# Patient Record
Sex: Female | Born: 1970
Health system: Southern US, Community
[De-identification: ages and names within clinical notes are randomized; demographics above are authoritative.]

## PROBLEM LIST (undated history)

## (undated) DIAGNOSIS — E039 Hypothyroidism, unspecified: Secondary | ICD-10-CM

## (undated) DIAGNOSIS — F32A Depression, unspecified: Secondary | ICD-10-CM

## (undated) DIAGNOSIS — F419 Anxiety disorder, unspecified: Secondary | ICD-10-CM

## (undated) DIAGNOSIS — F329 Major depressive disorder, single episode, unspecified: Secondary | ICD-10-CM

## (undated) HISTORY — PX: NO PAST SURGERIES: SHX2092

## (undated) HISTORY — PX: OTHER SURGICAL HISTORY: SHX169

---

## 2004-08-11 ENCOUNTER — Encounter: Payer: Self-pay | Admitting: General Practice

## 2004-09-08 ENCOUNTER — Encounter: Payer: Self-pay | Admitting: General Practice

## 2006-05-31 ENCOUNTER — Ambulatory Visit: Payer: Self-pay | Admitting: Family Medicine

## 2006-07-12 ENCOUNTER — Ambulatory Visit: Payer: Self-pay | Admitting: Gastroenterology

## 2006-12-25 ENCOUNTER — Emergency Department: Payer: Self-pay | Admitting: Emergency Medicine

## 2007-04-02 IMAGING — US ABDOMEN ULTRASOUND
1 series · 17 of 25 positions shown · non-contrast
Comparison: none

REASON FOR EXAM: RUQ abd pain
COMMENTS:

[Series 1: abdomen ultrasound · 17 of 50 slices shown]
[im 1/50]
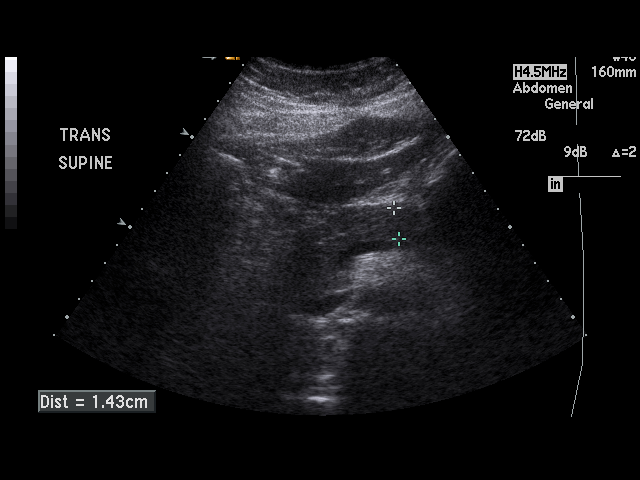
[im 5/50]
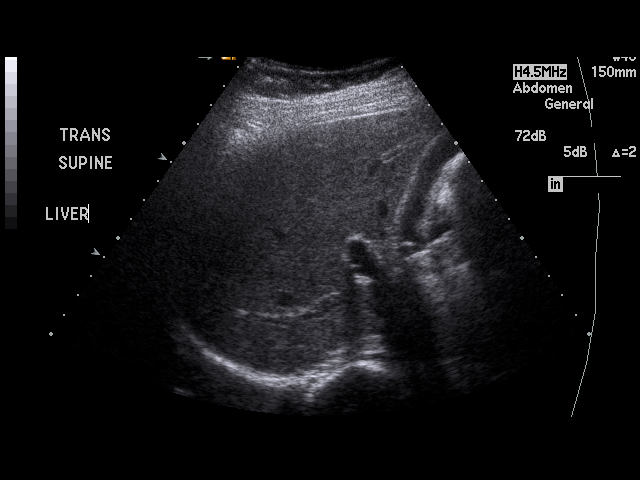
[im 7/50]
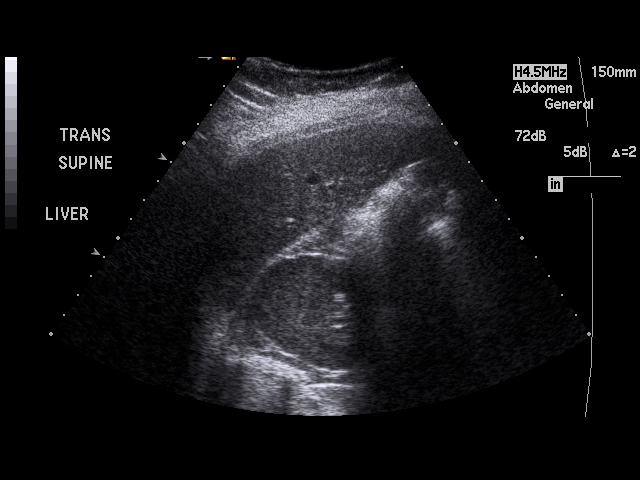
[im 11/50]
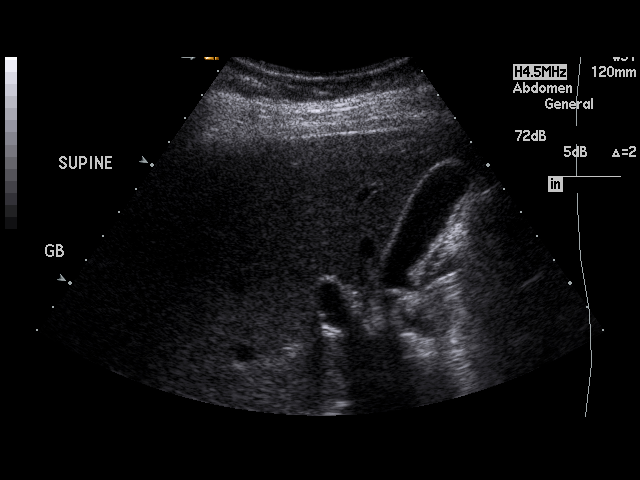
[im 13/50]
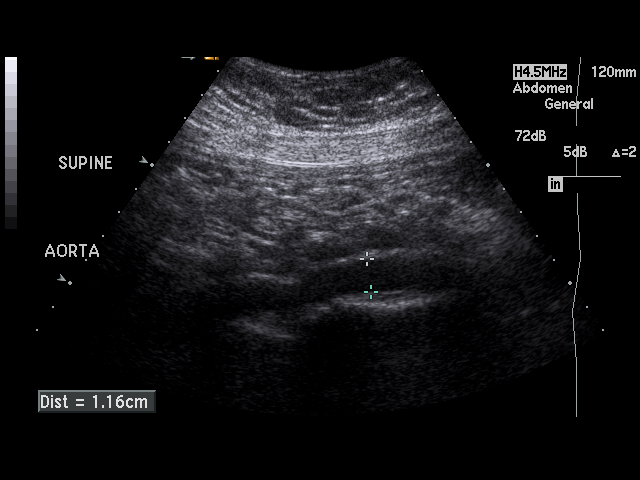
[im 17/50]
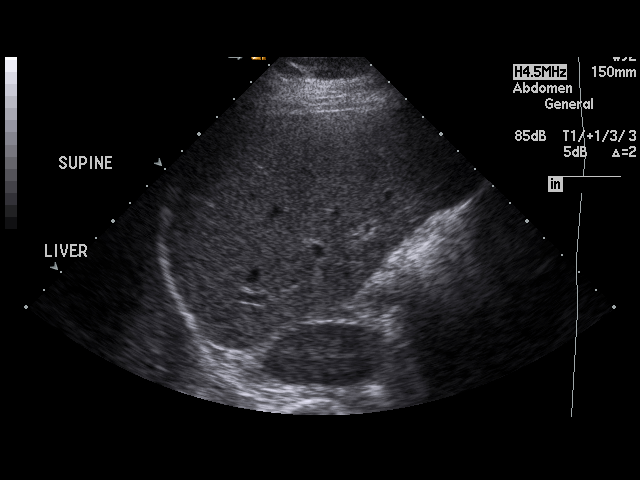
[im 19/50]
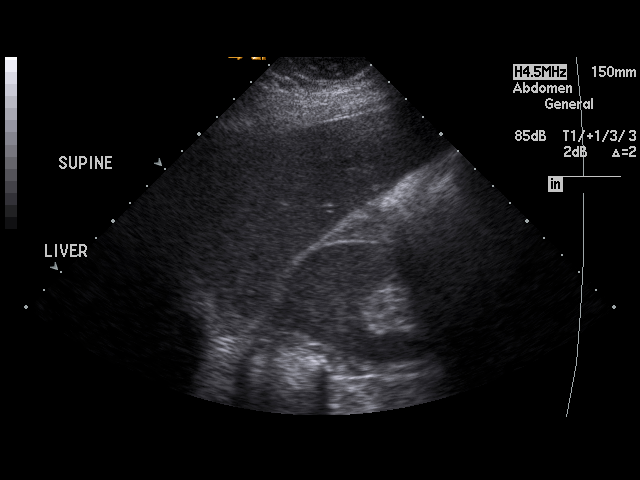
[im 23/50]
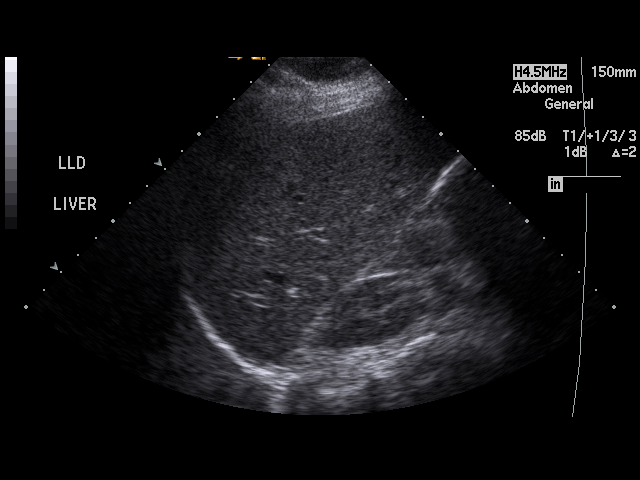
[im 25/50]
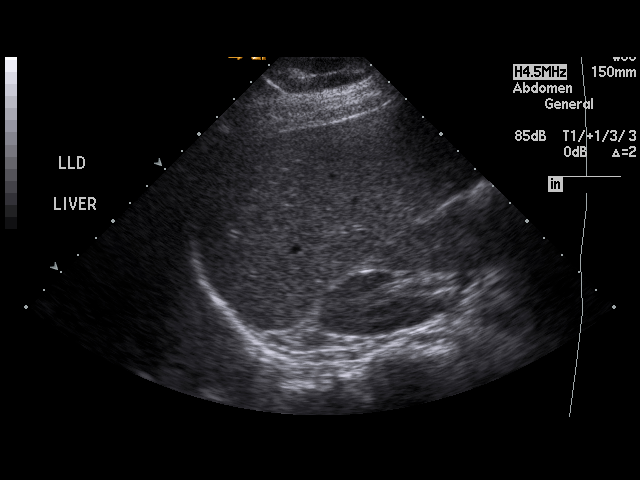
[im 27/50]
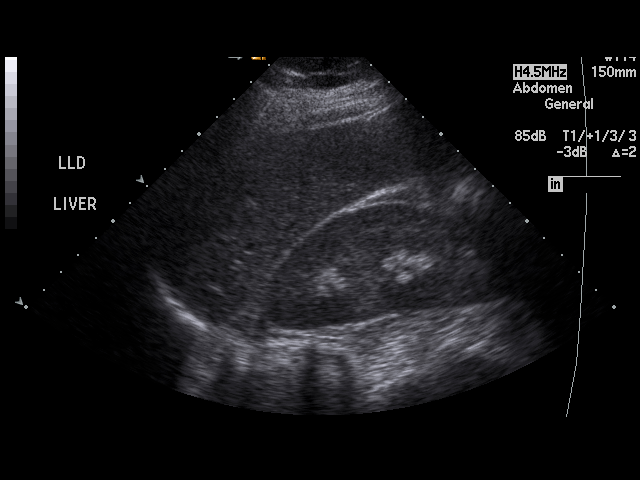
[im 31/50]
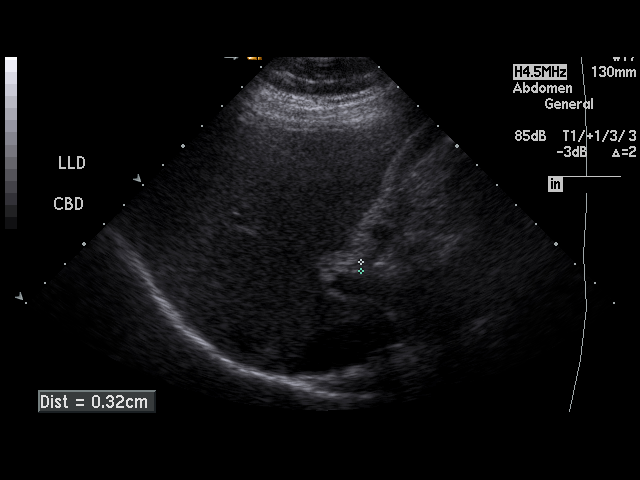
[im 33/50]
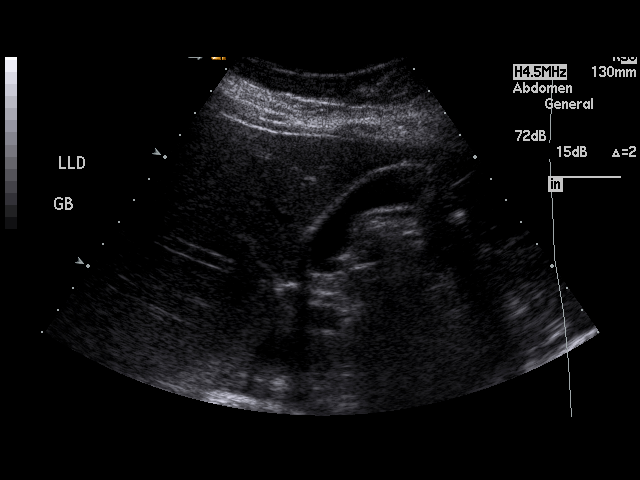
[im 37/50]
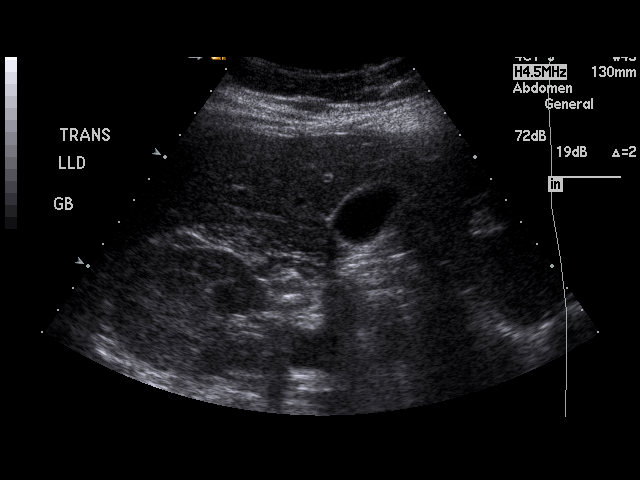
[im 39/50]
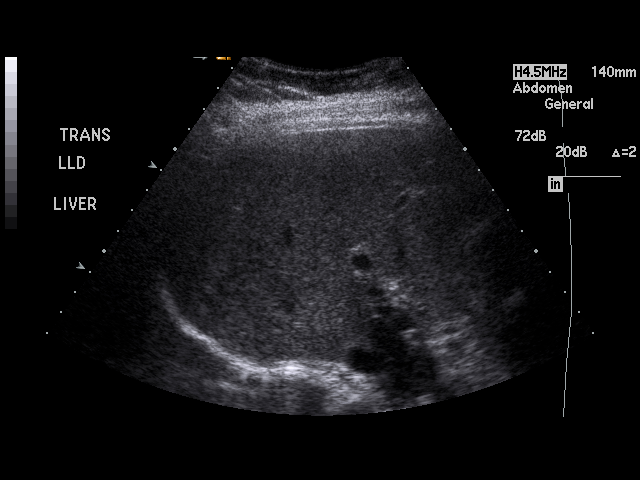
[im 43/50]
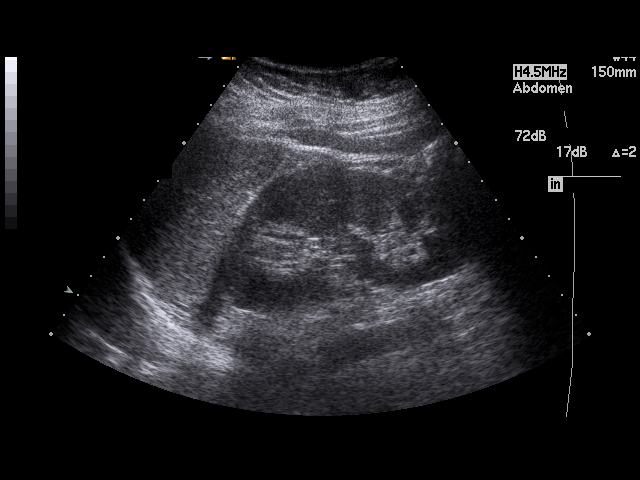
[im 45/50]
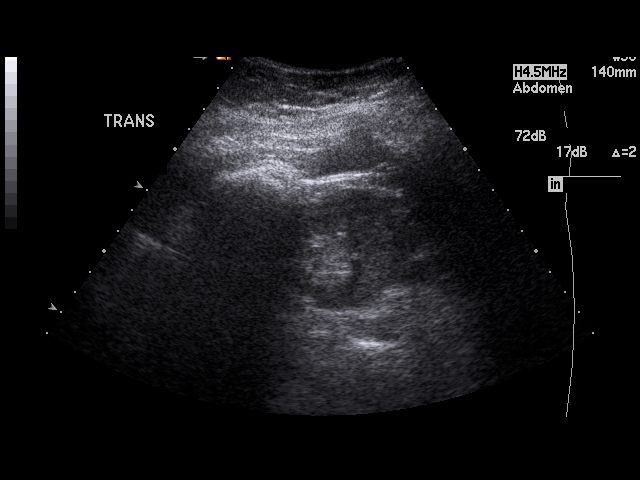
[im 50/50]
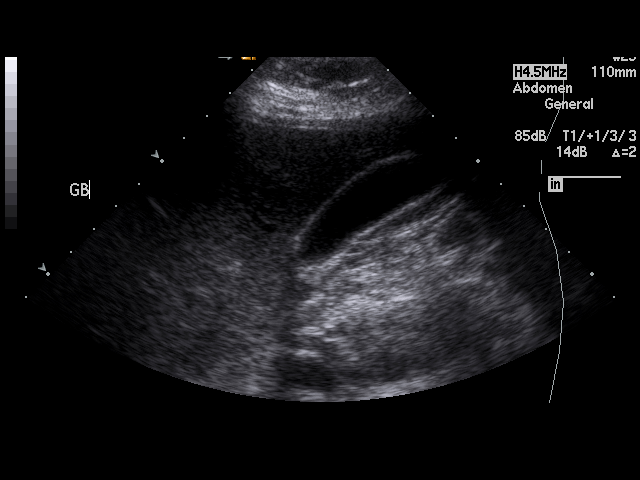

[17 of 25 positions shown; findings below may reference images not displayed]

PROCEDURE:     US  - US ABDOMEN GENERAL SURVEY  - May 31, 2006  [DATE]

RESULT:     The liver, spleen and pancreas are normal in appearance. No
gallstones are seen. There is no thickening of the gallbladder wall. The
common bile duct measures 3.3 mm in diameter, which is within normal limits.
The kidneys show no hydronephrosis. There is no ascites.
IMPRESSION: 1)No significant abnormalities are noted.

## 2011-11-30 ENCOUNTER — Emergency Department: Payer: Self-pay | Admitting: Emergency Medicine

## 2014-12-25 ENCOUNTER — Emergency Department: Payer: Self-pay | Admitting: Emergency Medicine

## 2015-06-17 ENCOUNTER — Emergency Department
Admission: EM | Admit: 2015-06-17 | Discharge: 2015-06-17 | Disposition: A | Discharge status: Home / Self Care | Payer: BLUE CROSS/BLUE SHIELD | Attending: Emergency Medicine | Admitting: Emergency Medicine

## 2015-06-17 ENCOUNTER — Encounter: Payer: Self-pay | Admitting: Emergency Medicine

## 2015-06-17 DIAGNOSIS — R51 Headache: Secondary | ICD-10-CM | POA: Diagnosis present

## 2015-06-17 DIAGNOSIS — R519 Headache, unspecified: Secondary | ICD-10-CM

## 2015-06-17 DIAGNOSIS — Z880 Allergy status to penicillin: Secondary | ICD-10-CM | POA: Diagnosis not present

## 2015-06-17 DIAGNOSIS — J3489 Other specified disorders of nose and nasal sinuses: Secondary | ICD-10-CM | POA: Insufficient documentation

## 2015-06-17 MED ORDER — METOCLOPRAMIDE HCL 10 MG PO TABS: 10.0000 mg | ORAL_TABLET | Freq: Three times a day (TID) | ORAL | Status: DC

## 2015-06-17 MED ORDER — DEXAMETHASONE 4 MG PO TABS
6.0000 mg | ORAL_TABLET | Freq: Once | ORAL | Status: AC
  Administered 2015-06-17: 6 mg via ORAL
  Filled 2015-06-17: qty 1.5

## 2015-06-17 MED ORDER — METOCLOPRAMIDE HCL 10 MG PO TABS
10.0000 mg | ORAL_TABLET | Freq: Once | ORAL | Status: AC
  Administered 2015-06-17: 10 mg via ORAL
  Filled 2015-06-17: qty 1

## 2015-06-17 MED ORDER — KETOROLAC TROMETHAMINE 10 MG PO TABS
10.0000 mg | ORAL_TABLET | Freq: Once | ORAL | Status: AC
  Administered 2015-06-17: 10 mg via ORAL
  Filled 2015-06-17: qty 1

## 2015-06-17 MED ORDER — DIPHENHYDRAMINE HCL 50 MG PO CAPS
50.0000 mg | ORAL_CAPSULE | Freq: Once | ORAL | Status: AC
  Administered 2015-06-17: 50 mg via ORAL
  Filled 2015-06-17: qty 1

## 2015-06-17 MED ORDER — DIPHENHYDRAMINE HCL 25 MG PO CAPS: 50.0000 mg | ORAL_CAPSULE | Freq: Four times a day (QID) | ORAL | Status: AC | PRN

## 2015-06-17 NOTE — Discharge Instructions (Signed)

## 2015-06-17 NOTE — ED Notes (Signed)
Pt to ed with c/o headache since Friday.  Pt states she has tried tylenol without relief.  Pt alert and oriented at this time, no neuro deficits noted.

## 2015-06-17 NOTE — ED Provider Notes (Signed)
[facility] Emergency Department Provider Note  ____________________________________________  Time seen: 10:05 AM  I have reviewed the triage vital signs and the nursing notes.   HISTORY  Chief Complaint Headache    HPI Donna Neal is a 44 y.o. female who complains of headache for the past 5 days. Gradual in onset and constant. No aggravating or alleviating factors. No numbness tingling weakness dizziness or syncope. No vision changes. She's had similar headaches in the past and has a history of migraines. She tried Tylenol without relief. No history of trauma. She is having rhinorrhea and states that she has frequent seasonal allergies.  Headache is bilateral frontal and throbbing.   History reviewed. No pertinent past medical history. Migraine headaches Seasonal allergies There are no active problems to display for this patient.   History reviewed. No pertinent past surgical history.  Current Outpatient Rx  Name  Route  Sig  Dispense  Refill  . diphenhydrAMINE (BENADRYL) 25 mg capsule   Oral   Take 2 capsules (50 mg total) by mouth every 6 (six) hours as needed.   60 capsule   0   . metoCLOPramide (REGLAN) 10 MG tablet   Oral   Take 1 tablet (10 mg total) by mouth 4 (four) times daily -  before meals and at bedtime.   60 tablet   0     Allergies Aspirin; Mucinex; Penicillins; and Zithromax  History reviewed. No pertinent family history.  Social History History  Substance Use Topics  . Smoking status: Never Smoker   . Smokeless tobacco: Not on file  . Alcohol Use: No    Review of Systems  Constitutional: No fever or chills. No weight changes Eyes:No blurry vision or double vision.  ENT: No sore throat. Positive rhinorrhea Cardiovascular: No chest pain. Respiratory: No dyspnea or cough. Gastrointestinal: Negative for abdominal pain, vomiting and diarrhea.  No BRBPR or melena. Genitourinary: Negative for dysuria, urinary  retention, bloody urine, or difficulty urinating. Musculoskeletal: Negative for back pain. No joint swelling or pain. Skin: Negative for rash. Neurological: Positive headache as above. Psychiatric:No anxiety or depression.   Endocrine:No hot/cold intolerance, changes in energy, or sleep difficulty.  10-point ROS otherwise negative.  ____________________________________________   PHYSICAL EXAM:  VITAL SIGNS: ED Triage Vitals  Enc Vitals Group     BP 06/17/15 0959 154/74 mmHg     Pulse Rate 06/17/15 0959 63     Resp 06/17/15 0959 20     Temp 06/17/15 0959 98.2 F (36.8 C)     Temp Source 06/17/15 0959 Oral     SpO2 06/17/15 0959 100 %     Weight 06/17/15 0959 170 lb (77.111 kg)     Height 06/17/15 0959  (1.575 m)     Head Cir --      Peak Flow --      Pain Score 06/17/15 0959 8     Pain Loc --      Pain Edu? --      Excl. in GC? --      Constitutional: Alert and oriented. Well appearing and in no distress. Eyes: No scleral icterus. No conjunctival pallor. PERRL. EOMI and painless. Funduscopy normal ENT   Head: Normocephalic and atraumatic. No pain on percussion of sinuses   Nose: Positive rhinorrhea. No septal hematoma   Mouth/Throat: MMM, no pharyngeal erythema. No peritonsillar mass. No uvula shift.   Neck: No stridor. No SubQ emphysema. No meningismus. Hematological/Lymphatic/Immunilogical: No cervical lymphadenopathy. Cardiovascular: RRR. Normal  and symmetric distal pulses are present in all extremities. No murmurs, rubs, or gallops. Respiratory: Normal respiratory effort without tachypnea nor retractions. Breath sounds are clear and equal bilaterally. No wheezes/rales/rhonchi. Gastrointestinal: Soft and nontender. No distention. There is no CVA tenderness.  No rebound, rigidity, or guarding. Genitourinary: deferred Musculoskeletal: Nontender with normal range of motion in all extremities. No joint effusions.  No lower extremity tenderness.  No  edema. Neurologic:   Normal speech and language.  CN 2-10 normal. Motor grossly intact. No pronator drift.  Normal gait. No gross focal neurologic deficits are appreciated.  Skin:  Skin is warm, dry and intact. No rash noted.  No petechiae, purpura, or bullae. Psychiatric: Mood and affect are normal. Speech and behavior are normal. Patient exhibits appropriate insight and judgment.  ____________________________________________    LABS (pertinent positives/negatives) (all labs ordered are listed, but only abnormal results are displayed) Labs Reviewed - No data to display ____________________________________________   EKG    ____________________________________________    RADIOLOGY    ____________________________________________   PROCEDURES  ____________________________________________   INITIAL IMPRESSION / ASSESSMENT AND PLAN / ED COURSE  Pertinent labs & imaging results that were available during my care of the patient were reviewed by me and considered in my medical decision making (see chart for details).  Patient presents with headache that is not concerning for tumor or intracranial hemorrhage or stroke. Low suspicion for meningitis encephalitis carotid injury several arteritis or glaucoma. No evidence of traumatic injury. This appears to be a benign type of headache which will treat symptomatically and managed outpatient with prescription medications. It is likely related to her seasonal allergies although there does not appear to be any significant sinusitis at this time.  ____________________________________________   FINAL CLINICAL IMPRESSION(S) / ED DIAGNOSES  Final diagnoses:  Acute nonintractable headache, unspecified headache type      Sharman Cheek, MD 06/17/15 1207

## 2015-07-08 ENCOUNTER — Other Ambulatory Visit: Payer: Self-pay | Admitting: Adult Health

## 2015-07-08 DIAGNOSIS — E669 Obesity, unspecified: Secondary | ICD-10-CM | POA: Insufficient documentation

## 2015-07-08 DIAGNOSIS — N63 Unspecified lump in unspecified breast: Secondary | ICD-10-CM

## 2015-07-24 ENCOUNTER — Ambulatory Visit
Admission: RE | Admit: 2015-07-24 | Discharge: 2015-07-24 | Disposition: A | Discharge status: Home / Self Care | Payer: BLUE CROSS/BLUE SHIELD | Source: Ambulatory Visit | Attending: Family Medicine | Admitting: Family Medicine

## 2015-07-24 DIAGNOSIS — N63 Unspecified lump in unspecified breast: Secondary | ICD-10-CM

## 2015-08-08 DIAGNOSIS — G43719 Chronic migraine without aura, intractable, without status migrainosus: Secondary | ICD-10-CM | POA: Insufficient documentation

## 2015-11-10 IMAGING — US US BREAST LTD UNI RIGHT INC AXILLA
1 series · 8 of 8 positions shown · non-contrast
Comparison: None.

CLINICAL DATA: Palpable lump right breast

EXAM:
DIGITAL DIAGNOSTIC BILATERAL MAMMOGRAM WITH 3D TOMOSYNTHESIS WITH
CAD
ULTRASOUND RIGHT BREAST

[Series 1: us breast ltd uni right inc axilla · 0.07mm/px · 8 of 8 slices shown]
[im 1/8]
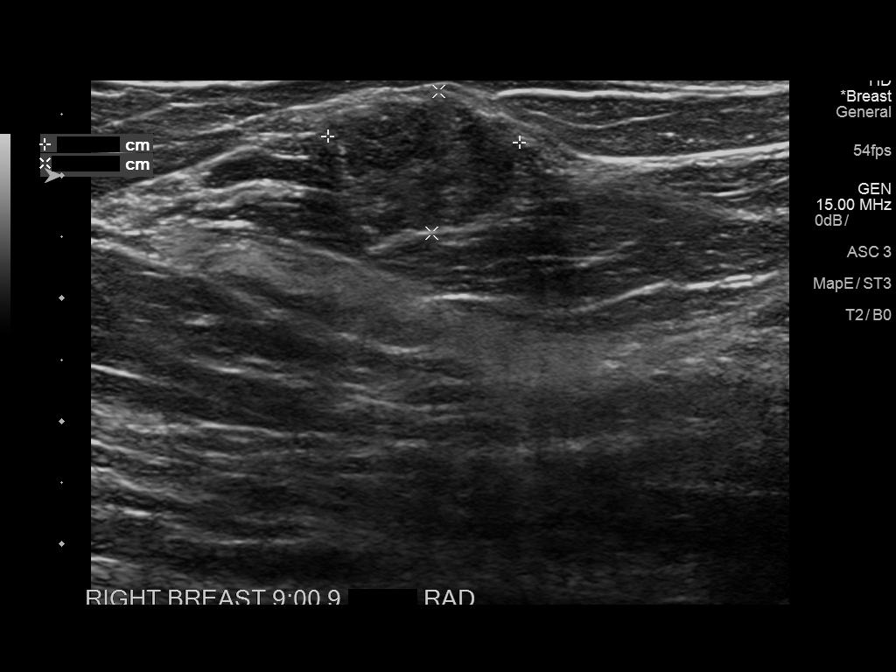
[im 2/8]
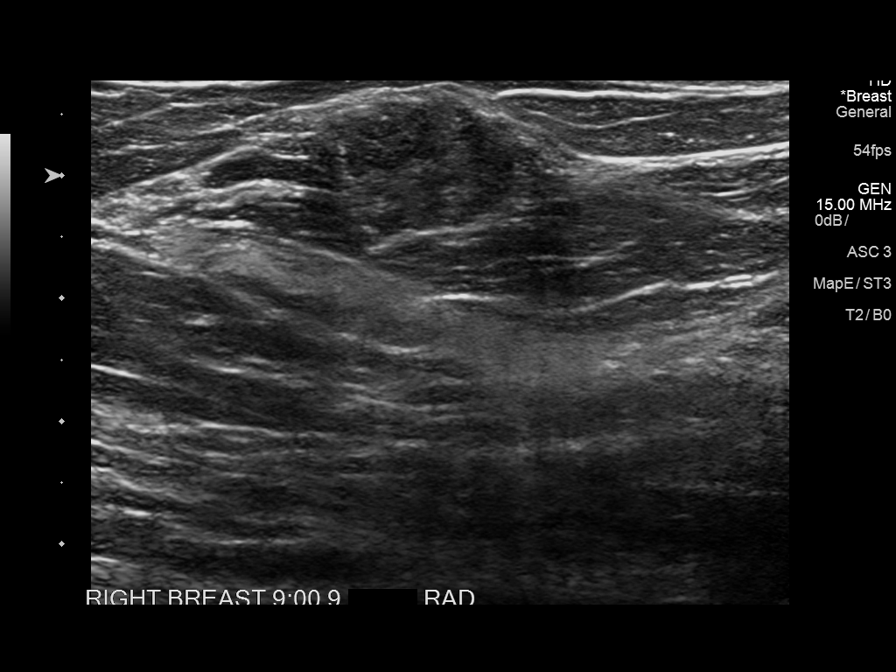
[im 3/8]
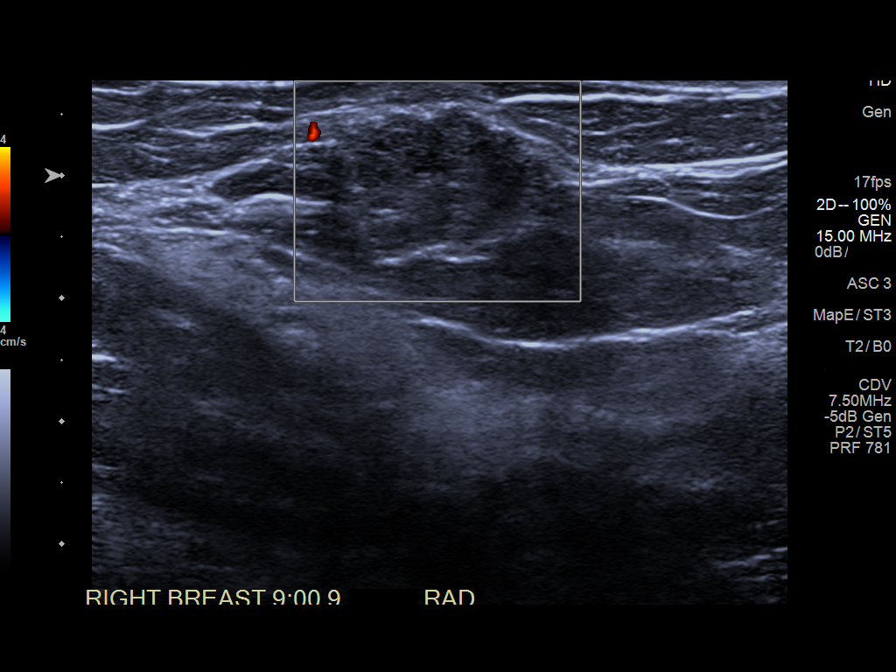
[im 4/8]
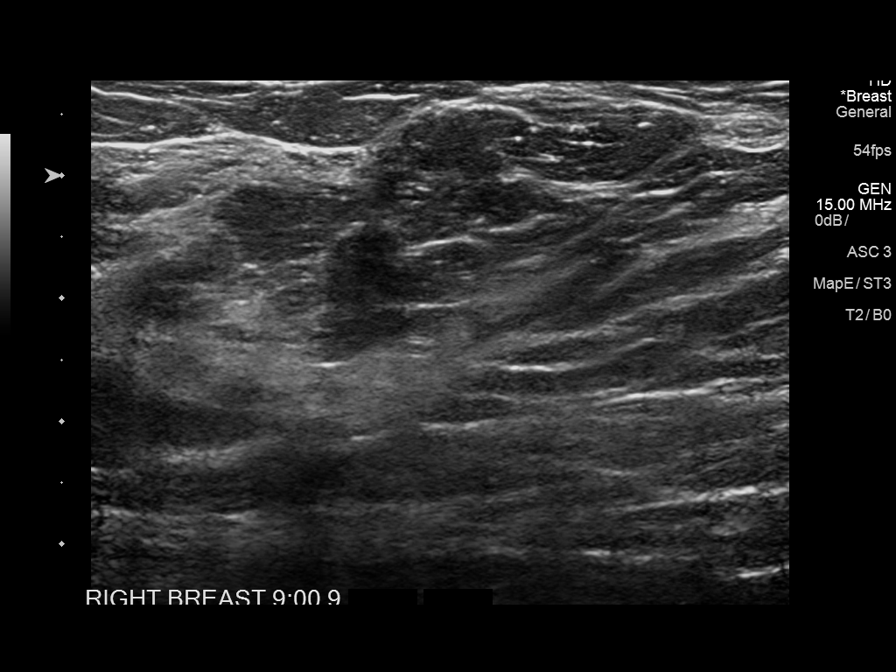
[im 5/8]
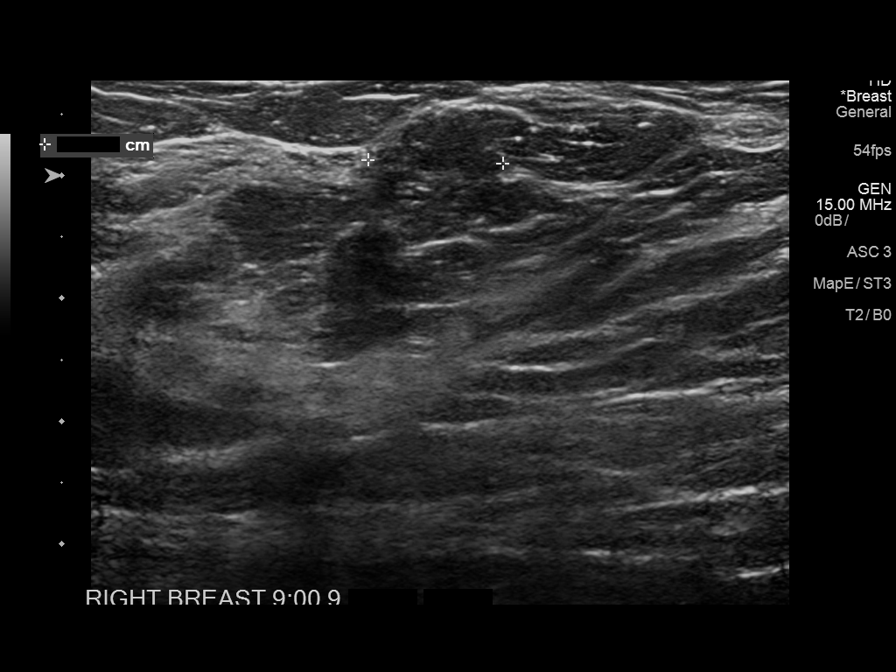
[im 6/8]
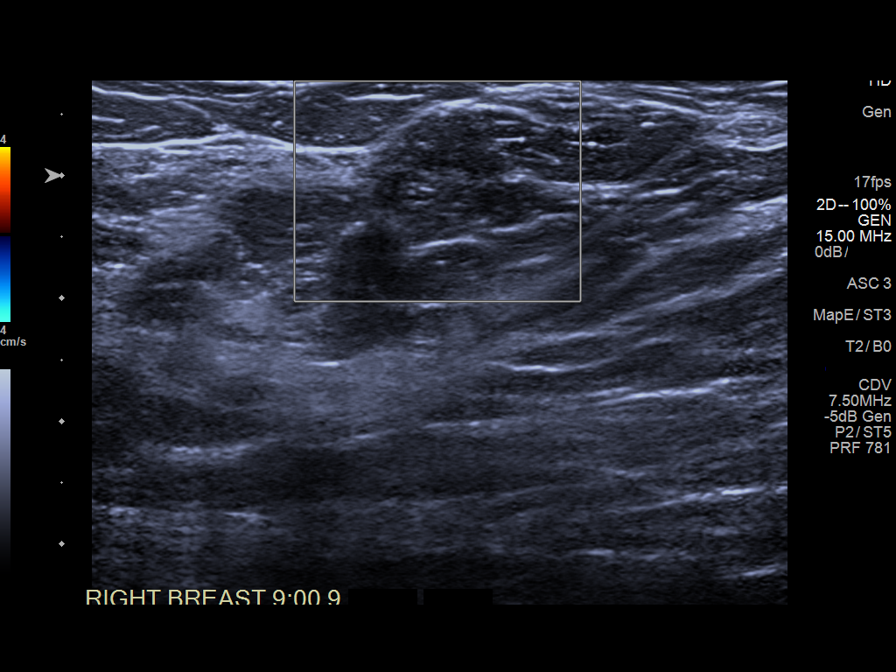
[im 7/8]
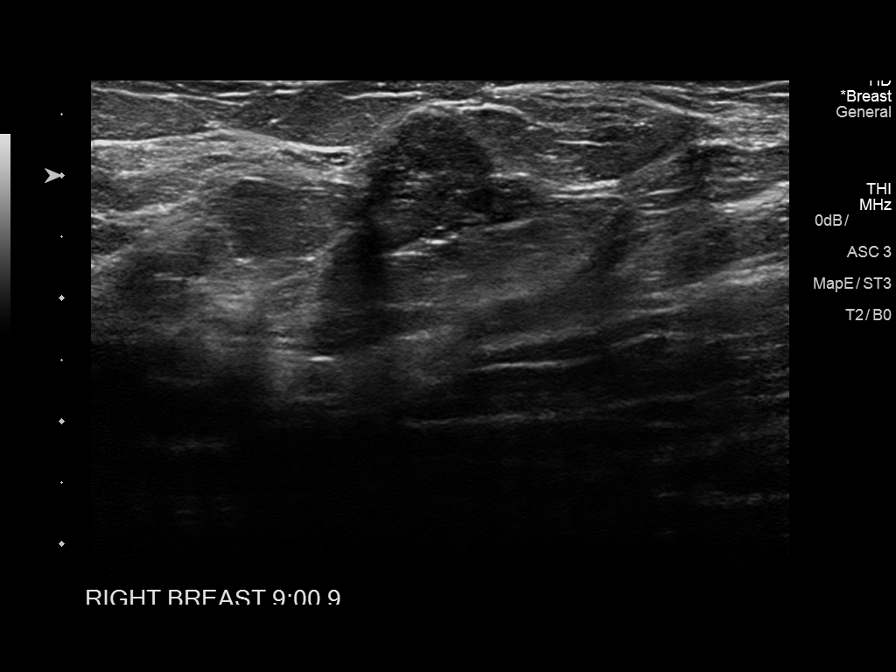
[im 8/8]
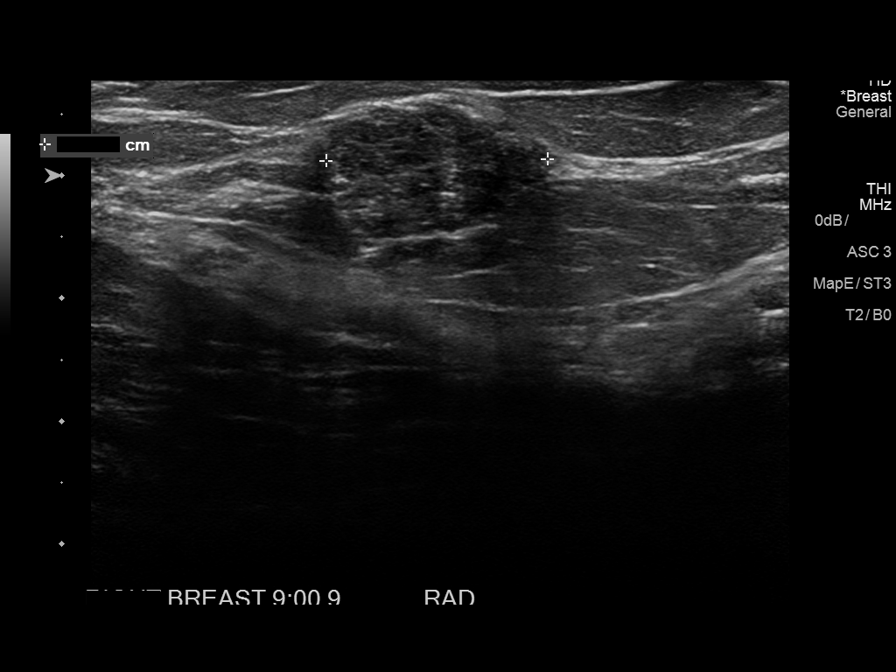

[8 of 8 positions shown; findings below may reference images not displayed]

ACR Breast Density Category b: There are scattered areas of
fibroglandular density.
FINDINGS: Cc and MLO views of bilateral breasts, spot for magnification CC and
lateral views of the left breast, spot tangential view of right
breast are submitted. In the palpable area right breast 9 o'clock
position, there is a oval mass. Images of the left breast
demonstrate a group of probable benign calcifications in the
upper-outer quadrant left breast.

Mammographic images were processed with CAD.

Targeted ultrasound is performed, showing oval hypoechoic lesion at
the right breast 9 o'clock 9 cm from nipple measuring 1.6 x 1.2 x
1.8 cm. This correlates to the mammographic finding.
IMPRESSION: Probable benign findings.

RECOMMENDATION:
Recommend six-month follow-up mammogram and ultrasound for right
breast mass and six-month follow-up mammogram for left breast
calcifications.

I have discussed the findings and recommendations with the patient.
Results were also provided in writing at the conclusion of the
visit. If applicable, a reminder letter will be sent to the patient
regarding the next appointment.

BI-RADS CATEGORY  3: Probably benign.

## 2015-11-27 DIAGNOSIS — E559 Vitamin D deficiency, unspecified: Secondary | ICD-10-CM | POA: Insufficient documentation

## 2018-01-11 ENCOUNTER — Encounter: Payer: Self-pay | Admitting: Family Medicine

## 2018-01-11 ENCOUNTER — Ambulatory Visit: Payer: BLUE CROSS/BLUE SHIELD | Admitting: Family Medicine

## 2018-01-11 VITALS — BP 138/68 | HR 100 | Temp 98.6°F | Resp 16 | Ht 61.0 in | Wt 190.6 lb

## 2018-01-11 DIAGNOSIS — Z8639 Personal history of other endocrine, nutritional and metabolic disease: Secondary | ICD-10-CM | POA: Diagnosis not present

## 2018-01-11 DIAGNOSIS — Z131 Encounter for screening for diabetes mellitus: Secondary | ICD-10-CM

## 2018-01-11 DIAGNOSIS — Z13220 Encounter for screening for lipoid disorders: Secondary | ICD-10-CM

## 2018-01-11 DIAGNOSIS — F320 Major depressive disorder, single episode, mild: Secondary | ICD-10-CM

## 2018-01-11 DIAGNOSIS — R5383 Other fatigue: Secondary | ICD-10-CM | POA: Diagnosis not present

## 2018-01-11 DIAGNOSIS — Z113 Encounter for screening for infections with a predominantly sexual mode of transmission: Secondary | ICD-10-CM

## 2018-01-11 DIAGNOSIS — Z6836 Body mass index (BMI) 36.0-36.9, adult: Secondary | ICD-10-CM

## 2018-01-11 DIAGNOSIS — Z1211 Encounter for screening for malignant neoplasm of colon: Secondary | ICD-10-CM | POA: Diagnosis not present

## 2018-01-11 DIAGNOSIS — Z0000 Encounter for general adult medical examination without abnormal findings: Secondary | ICD-10-CM | POA: Diagnosis not present

## 2018-01-11 DIAGNOSIS — E6609 Other obesity due to excess calories: Secondary | ICD-10-CM

## 2018-01-11 DIAGNOSIS — R198 Other specified symptoms and signs involving the digestive system and abdomen: Secondary | ICD-10-CM | POA: Diagnosis not present

## 2018-01-11 DIAGNOSIS — Z1239 Encounter for other screening for malignant neoplasm of breast: Secondary | ICD-10-CM

## 2018-01-11 MED ORDER — ESCITALOPRAM OXALATE 10 MG PO TABS: 10.0000 mg | ORAL_TABLET | Freq: Every day | ORAL | 0 refills | Status: DC

## 2018-01-11 NOTE — Progress Notes (Signed)
Name: Donna Neal   MRN: [id number]    DOB: 1971-04-25   Date:01/11/2018       Progress Note  Subjective  Chief Complaint  Chief Complaint  Patient presents with  . Annual Exam    HPI     Patient presents for annual CPE and the following concerns:  Abnormal Bowel Movements/Abdominal Pain: Patient endorses some indigestion to foods she normally eats - worse with greasy/heavy meals.  She reports having a "virus" 3 weeks ago and having consitpation after this. Prior to this episode, she would have BM's once a week, she took 2 exlax laxative one time 2 weeks ago and has been having 4-7BMs a week since then.  Pt noted one episode of blood in stool. Denies mucous or black and tarry stools. Endorses intermittent abdominal pain since her "virus" that resolves after BM.  Diet: Diet changed for the last 3 weeks since feeling ill - she states has recovered from a viral illness and now typically skips breakfast and has been eating broth, applesauce and jello, some pastas and occasionally chicken sandwiches. Drinks juice, sprite and water.  Exercise: Patient states at work walks a lot but she has to look after her niece when she gets home- and she is exhausted.   USPSTF grade A and B recommendations  Depression: States she has an intermittent depression for many years, byut has never been on medications or sought counseling, but would be willing to discuss medications today help improve mood and anxiety. Denies SI/HI.  Endorses fatigue and lack of motivation in her daily life. Depression screen PHQ 2/9 01/11/2018  Decreased Interest 1  Down, Depressed, Hopeless 0  PHQ - 2 Score 1  Altered sleeping 2  Tired, decreased energy 2  Change in appetite 0  Feeling bad or failure about yourself  1  Trouble concentrating 0  Moving slowly or fidgety/restless 0  Suicidal thoughts 0  PHQ-9 Score 6  Difficult doing work/chores Not difficult at all   Hypertension: Notes a history of elevated BP readings  in the past; at goal today, we will continue to monitor. BP Readings from Last 3 Encounters:  01/11/18 138/68  06/17/15 (!) 139/92  Patient states she has gotten headaches in the past when hypertensive but is good today.  Obesity: Wt Readings from Last 3 Encounters:  01/11/18 190 lb 9.6 oz (86.5 kg)  06/17/15 170 lb (77.1 kg)   BMI Readings from Last 3 Encounters:  01/11/18 36.01 kg/m  06/17/15 31.09 kg/m     Alcohol: no Tobacco use: no HIV, hep B, hep C: does not qualify for Hep C. Will complete STD testing- pt is sexually active  STD testing and prevention (chl/gon/syphilis): discussed- pt uses condoms. Will check today  Intimate partner violence: no Sexual History/Pain during Intercourse: no Menstrual History/LMP/Abnormal Bleeding: no longer having periods stopped June 2017.  Incontinence Symptoms: denies   Advanced Care Planning: A voluntary discussion about advance care planning including the explanation and discussion of advance directives.  Discussed health care proxy and Living will, and the patient was able to identify a health care proxy as Mother- Donna Neal.  Patient does not have a living will at present time. If patient does have living will, I have requested they bring this to the clinic to be scanned in to their chart.  Breast cancer: maternal aunt- 2016; Last mammogram was in 2015 without abnormal findings No results found for: [facility]  BRCA gene screening: doesn't qualify  Cervical cancer screening: Normal  Pap 2016- wants to check with insurance company to ensure coverage  Osteoporosis: no family history  No results found for: HMDEXASCAN  Fall prevention/vitamin D: has hx of vitamin D deficiency takes it OTC in the past. Will check Vitamin D levels today Lipids: will check today  No results found for: CHOL No results found for: HDL No results found for: LDLCALC No results found for: TRIG No results found for: CHOLHDL No results found for:  LDLDIRECT  Glucose: Will check today No results found for: GLUCOSE, GLUCAP  Skin cancer: hasn't noticed any changes in skin  Colorectal cancer: No family history see HPI    There are no active problems to display for this patient.   Past Surgical History:  Procedure Laterality Date  . no past surgery      Family History  Problem Relation Age of Onset  . Hypertension Mother   . Hypertension Brother   . Hypertension Maternal Aunt   . Cancer Maternal Uncle   . Cancer Maternal Grandmother     Social History   Socioeconomic History  . Marital status: Single    Spouse name: Not on file  . Number of children: Not on file  . Years of education: Not on file  . Highest education level: Not on file  Social Needs  . Financial resource strain: Not on file  . Food insecurity - worry: Not on file  . Food insecurity - inability: Not on file  . Transportation needs - medical: Not on file  . Transportation needs - non-medical: Not on file  Occupational History  . Not on file  Tobacco Use  . Smoking status: Never Smoker  . Smokeless tobacco: Never Used  Substance and Sexual Activity  . Alcohol use: No  . Drug use: No  . Sexual activity: No  Other Topics Concern  . Not on file  Social History Narrative  . Not on file     Current Outpatient Medications:  .  diphenhydrAMINE (BENADRYL) 25 mg capsule, Take 2 capsules (50 mg total) by mouth every 6 (six) hours as needed. (Patient not taking: Reported on 01/11/2018), Disp: 60 capsule, Rfl: 0 .  metoCLOPramide (REGLAN) 10 MG tablet, Take 1 tablet (10 mg total) by mouth 4 (four) times daily -  before meals and at bedtime. (Patient not taking: Reported on 01/11/2018), Disp: 60 tablet, Rfl: 0  Allergies  Allergen Reactions  . Aspirin Hives  . Mucinex [Guaifenesin Er] Hives  . Penicillins Hives  . Zithromax [Azithromycin] Hives     ROS  Constitutional: Negative for fever or weight change.  Respiratory: Negative for cough and  shortness of breath.   Cardiovascular: Negative for chest pain or palpitations.  Gastrointestinal: See HPI Musculoskeletal: Negative for gait problem or joint swelling.  Skin: Negative for rash.  Neurological: Positive for occasional headaches- relief with tylenol. Negative for dizziness.  No other specific complaints in a complete review of systems (except as listed in HPI above).   Objective  Vitals:   01/11/18 0902  BP: 138/68  Pulse: 100  Resp: 16  Temp: 98.6 F (37 C)  TempSrc: Oral  SpO2: 98%  Weight: 190 lb 9.6 oz (86.5 kg)  Height: _0  (1.549 m)    Body mass index is 36.01 kg/m.  Physical Exam Constitutional: Patient appears well-developed and well-nourished. No distress.  HENT: Head: Normocephalic and atraumatic. Ears: B TMs ok, no erythema or effusion; Nose: Nose normal. Mouth/Throat: Oropharynx is clear and moist. No oropharyngeal exudate.  Eyes: Conjunctivae and EOM are normal. Pupils are equal, round, and reactive to light. No scleral icterus.  Neck: Normal range of motion. Neck supple. No JVD present. No thyromegaly present.  Cardiovascular: Normal rate, regular rhythm and normal heart sounds.  No murmur heard. No BLE edema. Pulmonary/Chest: Effort normal and breath sounds normal. No respiratory distress. Abdominal: Soft. Bowel sounds are hypoactive throughout, no distension. There is no tenderness. no masses Breast: no lumps or masses, no nipple discharge or rashes FEMALE GENITALIA: Deferred Bimanual exam normal without masses Musculoskeletal: Normal range of motion, no joint effusions. No gross deformities Neurological: he is alert and oriented to person, place, and time. No cranial nerve deficit. Coordination, balance, strength, speech and gait are normal.  Skin: Skin is warm and dry. No rash noted. No erythema.  Psychiatric: Patient has a normal mood and affect. behavior is normal. Judgment and thought content normal.  No results found for this or any  previous visit (from the past 2160 hour(s)).  PHQ2/9: Depression screen PHQ 2/9 01/11/2018  Decreased Interest 1  Down, Depressed, Hopeless 0  PHQ - 2 Score 1  Altered sleeping 2  Tired, decreased energy 2  Change in appetite 0  Feeling bad or failure about yourself  1  Trouble concentrating 0  Moving slowly or fidgety/restless 0  Suicidal thoughts 0  PHQ-9 Score 6  Difficult doing work/chores Not difficult at all   Fall Risk: Fall Risk  01/11/2018  Falls in the past year? No   Functional Status Survey: Is the patient deaf or have difficulty hearing?: No Does the patient have difficulty seeing, even when wearing glasses/contacts?: No Does the patient have difficulty concentrating, remembering, or making decisions?: No Does the patient have difficulty walking or climbing stairs?: No Does the patient have difficulty dressing or bathing?: No Does the patient have difficulty doing errands alone such as visiting a doctor's office or shopping?: No   Assessment & Plan  1. Well woman exam (no gynecological exam) -USPSTF grade A and B recommendations reviewed with patient; age-appropriate recommendations, preventive care, screening tests, etc discussed and encouraged; healthy living encouraged; see AVS for patient education given to patient -Discussed importance of 150 minutes of physical activity weekly, eat two servings of fish weekly, eat one serving of tree nuts ( cashews, pistachios, pecans, almonds.Marland Kitchen) every other day, eat 6 servings of fruit/vegetables daily and drink plenty of water and avoid sweet beverages.   2. Current mild episode of major depressive disorder without prior episode (HCC) - escitalopram (LEXAPRO) 10 MG tablet; Take 1 tablet (10 mg total) by mouth daily.  Dispense: 30 tablet; Refill: 0 - Discussed her depression/fatigue in detail, she wills tart lexapro and schedule close follow up.  We will also do fatigue work-up to ensure thyroid, low vitamin D, etc it not  contributing to her symptoms.  3. Colon cancer screening - Ambulatory referral to Gastroenterology - pt is 46, having abnormal BM's, and is African American - advised will likely qualify for early colorectal cancer screening, and I recommend she see to better determine her eligibility.  4. History of vitamin D deficiency - VITAMIN D 25 Hydroxy (Vit-D Deficiency, Fractures)  5. Screen for STD (sexually transmitted disease) - HIV antibody (with reflex) - RPR - C. trachomatis/N. gonorrhoeae RNA  6. Abnormal bowel movement - Ambulatory referral to Gastroenterology - we will refer to GI due to continued abnormal BM's - see discussion above.  7. Fatigue, unspecified type - TSH - COMPLETE METABOLIC PANEL WITH GFR  8. Diabetes mellitus screening - COMPLETE METABOLIC PANEL WITH GFR  9. Class 2 obesity due to excess calories without serious comorbidity with body mass index (BMI) of 36.0 to 36.9 in adult - Lipid Profile - TSH - Lifestyle modifications discussed as above, pt is not motivated to start exercising at this time; diet has been restricted due to GI upset.  10. Screening for hyperlipidemia - Lipid Profile  11. Breast cancer screening - MM Digital Screening; Future  -Red flags and when to present for emergency care or RTC including fever >101.73F, chest pain, shortness of breath, new/worsening/un-resolving symptoms, severe abdominal pain, reviewed with patient at time of visit. Follow up and care instructions discussed and provided in AVS.

## 2018-01-11 NOTE — Patient Instructions (Addendum)
Call insurance to check on coverage for pap smear- Will it be on the calender year or on/after your birthday.  Preventive Care 40-64 Years, Female Preventive care refers to lifestyle choices and visits with your health care provider that can promote health and wellness. What does preventive care include?  A yearly physical exam. This is also called an annual well check.  Dental exams once or twice a year.  Routine eye exams. Ask your health care provider how often you should have your eyes checked.  Personal lifestyle choices, including: ? Daily care of your teeth and gums. ? Regular physical activity. ? Eating a healthy diet. ? Avoiding tobacco and drug use. ? Limiting alcohol use. ? Practicing safe sex. ? Taking low-dose aspirin daily starting at age 44. ? Taking vitamin and mineral supplements as recommended by your health care provider. What happens during an annual well check? The services and screenings done by your health care provider during your annual well check will depend on your age, overall health, lifestyle risk factors, and family history of disease. Counseling Your health care provider may ask you questions about your:  Alcohol use.  Tobacco use.  Drug use.  Emotional well-being.  Home and relationship well-being.  Sexual activity.  Eating habits.  Work and work Statistician.  Method of birth control.  Menstrual cycle.  Pregnancy history.  Screening You may have the following tests or measurements:  Height, weight, and BMI.  Blood pressure.  Lipid and cholesterol levels. These may be checked every 5 years, or more frequently if you are over 56 years old.  Skin check.  Lung cancer screening. You may have this screening every year starting at age 2 if you have a 30-pack-year history of smoking and currently smoke or have quit within the past 15 years.  Fecal occult blood test (FOBT) of the stool. You may have this test every year starting at  age 61.  Flexible sigmoidoscopy or colonoscopy. You may have a sigmoidoscopy every 5 years or a colonoscopy every 10 years starting at age 75.  Hepatitis C blood test.  Hepatitis B blood test.  Sexually transmitted disease (STD) testing.  Diabetes screening. This is done by checking your blood sugar (glucose) after you have not eaten for a while (fasting). You may have this done every 1-3 years.  Mammogram. This may be done every 1-2 years. Talk to your health care provider about when you should start having regular mammograms. This may depend on whether you have a family history of breast cancer.  BRCA-related cancer screening. This may be done if you have a family history of breast, ovarian, tubal, or peritoneal cancers.  Pelvic exam and Pap test. This may be done every 3 years starting at age 54. Starting at age 52, this may be done every 5 years if you have a Pap test in combination with an HPV test.  Bone density scan. This is done to screen for osteoporosis. You may have this scan if you are at high risk for osteoporosis.  Discuss your test results, treatment options, and if necessary, the need for more tests with your health care provider. Vaccines Your health care provider may recommend certain vaccines, such as:  Influenza vaccine. This is recommended every year.  Tetanus, diphtheria, and acellular pertussis (Tdap, Td) vaccine. You may need a Td booster every 10 years.  Varicella vaccine. You may need this if you have not been vaccinated.  Zoster vaccine. You may need this after age 17.  Measles, mumps, and rubella (MMR) vaccine. You may need at least one dose of MMR if you were born in 1957 or later. You may also need a second dose.  Pneumococcal 13-valent conjugate (PCV13) vaccine. You may need this if you have certain conditions and were not previously vaccinated.  Pneumococcal polysaccharide (PPSV23) vaccine. You may need one or two doses if you smoke cigarettes or if  you have certain conditions.  Meningococcal vaccine. You may need this if you have certain conditions.  Hepatitis A vaccine. You may need this if you have certain conditions or if you travel or work in places where you may be exposed to hepatitis A.  Hepatitis B vaccine. You may need this if you have certain conditions or if you travel or work in places where you may be exposed to hepatitis B.  Haemophilus influenzae type b (Hib) vaccine. You may need this if you have certain conditions.  Talk to your health care provider about which screenings and vaccines you need and how often you need them. This information is not intended to replace advice given to you by your health care provider. Make sure you discuss any questions you have with your health care provider. Document Released: 11/21/2015 Document Revised: 07/14/2016 Document Reviewed: 08/26/2015 Elsevier Interactive Patient Education  [employer].

## 2018-01-25 ENCOUNTER — Encounter: Payer: Self-pay | Admitting: Family Medicine

## 2018-01-25 ENCOUNTER — Ambulatory Visit: Payer: BLUE CROSS/BLUE SHIELD | Admitting: Family Medicine

## 2018-01-25 VITALS — BP 128/62 | HR 87 | Temp 98.4°F | Resp 16 | Ht 61.0 in | Wt 188.2 lb

## 2018-01-25 DIAGNOSIS — F320 Major depressive disorder, single episode, mild: Secondary | ICD-10-CM

## 2018-01-25 DIAGNOSIS — G43719 Chronic migraine without aura, intractable, without status migrainosus: Secondary | ICD-10-CM

## 2018-01-25 DIAGNOSIS — Z124 Encounter for screening for malignant neoplasm of cervix: Secondary | ICD-10-CM | POA: Diagnosis not present

## 2018-01-25 DIAGNOSIS — R5383 Other fatigue: Secondary | ICD-10-CM | POA: Insufficient documentation

## 2018-01-25 DIAGNOSIS — E669 Obesity, unspecified: Secondary | ICD-10-CM

## 2018-01-25 DIAGNOSIS — R198 Other specified symptoms and signs involving the digestive system and abdomen: Secondary | ICD-10-CM | POA: Diagnosis not present

## 2018-01-25 DIAGNOSIS — Z23 Encounter for immunization: Secondary | ICD-10-CM | POA: Diagnosis not present

## 2018-01-25 MED ORDER — ESCITALOPRAM OXALATE 10 MG PO TABS: 10.0000 mg | ORAL_TABLET | Freq: Every day | ORAL | 0 refills | Status: DC

## 2018-01-25 NOTE — Patient Instructions (Signed)
Keep a headache diary - write down when headache occurs, for how long it lasts, and any associated symptoms.  Also write down what you were doing that day and what you ate that day.

## 2018-01-25 NOTE — Progress Notes (Signed)
Name: Donna Neal   MRN: [id number]    DOB: 1971/02/09   Date:01/25/2018       Progress Note  Subjective  Chief Complaint  Chief Complaint  Patient presents with  . Follow-up    2 week recheck    HPI  Depression: States she has an intermittent depression for many years, started LExapro 2 weeks ago and says she is already feeling better - less mood swings, feeling more motivated to exercise. Denies SI/HI.  Endorses fatigue and lack of motivation in her daily life. PHQ-9 score of 3 today - improved from 6 2 weeks ago.  Obesity: She has been eating more baked foods, avoiding fried foods, eating more salads and fruits. She has been walking more now that her mood has improved - walking daily and sometimes on the weekends for about 30-40 minutes.  Abnormal Bowel Movements: Was referred to GI, has appointment next month for evaluation with Dr. Rica Koyanagi.  Abnormal BM's have improved - denies blood in stool, black and tarry stools, or mucous in stools.  History of migraines: Takes Tylenol because she is unable to take anything with aspirin/NSAIDS because she breaks out in hives.  She notes about 2-3 headaches in a month - will have nausea, throbbing pain, light and noise sensitivity; usually headaches last 1-2 days because tylenol does not always work. Triggers include forgetting to eat a meal, stress/frustration. She has seen Dr. Sherryll Burger with Neurology in the past - was put on Topamax but did not tolerate it.  Health Maintenance: - Pap Smear: She checked with insurance and is ok having this performed today if needed. - Fasting Labs: She will have these done today. - TDAP: Will do today  Patient Active Problem List   Diagnosis Date Noted  . Abnormal bowel movement 01/25/2018  . Fatigue 01/25/2018  . Current mild episode of major depressive disorder without prior episode (HCC) 01/25/2018  . Vitamin D deficiency disease 11/27/2015  . Intractable chronic migraine without aura and without status  migrainosus 08/08/2015  . Obesity, Class II, BMI 35-39.9 07/08/2015   Past Surgical History:  Procedure Laterality Date  . no past surgery      Family History  Problem Relation Age of Onset  . Hypertension Mother   . Hypertension Brother   . Hypertension Maternal Aunt   . Cancer Maternal Uncle   . Cancer Maternal Grandmother     Social History   Socioeconomic History  . Marital status: Single    Spouse name: Not on file  . Number of children: Not on file  . Years of education: Not on file  . Highest education level: Not on file  Social Needs  . Financial resource strain: Not on file  . Food insecurity - worry: Not on file  . Food insecurity - inability: Not on file  . Transportation needs - medical: Not on file  . Transportation needs - non-medical: Not on file  Occupational History  . Not on file  Tobacco Use  . Smoking status: Never Smoker  . Smokeless tobacco: Never Used  Substance and Sexual Activity  . Alcohol use: No  . Drug use: No  . Sexual activity: Yes    Birth control/protection: Condom  Other Topics Concern  . Not on file  Social History Narrative  . Not on file     Current Outpatient Medications:  .  cholecalciferol (VITAMIN D) 1000 units tablet, Take 1,000 Units by mouth daily., Disp: , Rfl:  .  diphenhydrAMINE (BENADRYL)  25 mg capsule, Take 2 capsules (50 mg total) by mouth every 6 (six) hours as needed., Disp: 60 capsule, Rfl: 0 .  escitalopram (LEXAPRO) 10 MG tablet, Take 1 tablet (10 mg total) by mouth daily., Disp: 90 tablet, Rfl: 0  Allergies  Allergen Reactions  . Aspirin Hives  . Mucinex [Guaifenesin Er] Hives  . Penicillins Hives  . Zithromax [Azithromycin] Hives    ROS Constitutional: Negative for fever or weight change.  Respiratory: Negative for cough and shortness of breath.   Cardiovascular: Negative for chest pain or palpitations.  Gastrointestinal: Negative for abdominal pain, no bowel changes.  Musculoskeletal: Negative  for gait problem or joint swelling.  Skin: Negative for rash.  Neurological: Negative for dizziness; endorses some headache.  No other specific complaints in a complete review of systems (except as listed in HPI above).  Objective  Vitals:   01/25/18 0756  BP: 128/62  Pulse: 87  Resp: 16  Temp: 98.4 F (36.9 C)  TempSrc: Oral  SpO2: 95%  Weight: 188 lb 3.2 oz (85.4 kg)  Height: 5\' 1"  (1.549 m)   Body mass index is 35.56 kg/m.  Physical Exam Constitutional: Patient appears well-developed and well-nourished. No distress.  HENT: Head: Normocephalic and atraumatic. Neck: Normal range of motion. Neck supple. No JVD present. No thyromegaly present.  Cardiovascular: Normal rate, regular rhythm and normal heart sounds.  No murmur heard. No BLE edema. Pulmonary/Chest: Effort normal and breath sounds normal. No respiratory distress. FEMALE GENITALIA:  External genitalia normal External urethra normal Vaginal vault normal without discharge or lesions Cervix normal without discharge or lesions Bimanual exam normal without masses Musculoskeletal: Normal range of motion, no joint effusions. No gross deformities Neurological: she is alert and oriented to person, place, and time. No cranial nerve deficit. Coordination, balance, strength, speech and gait are normal.  Skin: Skin is warm and dry. No rash noted. No erythema.  Psychiatric: Patient has a normal mood and affect. behavior is normal. Judgment and thought content normal.  No results found for this or any previous visit (from the past 72 hour(s)).  PHQ2/9[facility] 01/25/2018 01/11/2018  Decreased Interest 0 1  Down, Depressed, Hopeless 0 0  PHQ - 2 Score 0 1  Altered sleeping 3 2  Tired, decreased energy 0 2  Change in appetite 0 0  Feeling bad or failure about yourself  0 1  Trouble concentrating 0 0  Moving slowly or fidgety/restless 0 0  Suicidal thoughts 0 0  PHQ-9 Score 3 6  Difficult doing work/chores  Not difficult at all Not difficult at all   Fall Risk: Fall Risk  01/11/2018  Falls in the past year? No    Assessment & Plan  1. Current mild episode of major depressive disorder without prior episode (HCC) - escitalopram (LEXAPRO) 10 MG tablet; Take 1 tablet (10 mg total) by mouth daily.  Dispense: 90 tablet; Refill: 0  2. Cervical cancer screening - Pap IG and HPV (high risk) DNA detection  3. Need for Tdap vaccination - Tdap vaccine greater than or equal to 7yo IM  4. Abnormal bowel movement - Seeing GI in 1 month.  5. Obesity, Class II, BMI 35-39.9 - Discussed importance of 150 minutes of physical activity weekly, eat two servings of fish weekly, eat one serving of tree nuts ( cashews, pistachios, pecans, almonds.Marland Kitchen.) every other day, eat 6 servings of fruit/vegetables daily and drink plenty of water and avoid sweet beverages.  - She has lost 3lbs  since last visit - she is congratulated on her progress and encouraged to continue.  6. Intractable chronic migraine without aura and without status migrainosus - Keep headache diary - We will not start daily medication at this time.  Will check labs, may consider PRN triptan or other PRN after labs are returned. - Migraine diet handout provided and discussed.

## 2018-01-26 ENCOUNTER — Other Ambulatory Visit: Payer: Self-pay | Admitting: Family Medicine

## 2018-01-26 DIAGNOSIS — E0590 Thyrotoxicosis, unspecified without thyrotoxic crisis or storm: Secondary | ICD-10-CM

## 2018-01-26 LAB — COMPLETE METABOLIC PANEL WITH GFR
AG RATIO: 1.1 (calc) (ref 1.0–2.5)
ALT: 17 U/L (ref 6–29)
AST: 19 U/L (ref 10–35)
Albumin: 4.2 g/dL (ref 3.6–5.1)
Alkaline phosphatase (APISO): 82 U/L (ref 33–115)
BUN: 11 mg/dL (ref 7–25)
CALCIUM: 9.4 mg/dL (ref 8.6–10.2)
CHLORIDE: 103 mmol/L (ref 98–110)
CO2: 29 mmol/L (ref 20–32)
Creat: 0.54 mg/dL (ref 0.50–1.10)
GFR, EST AFRICAN AMERICAN: 129 mL/min/{1.73_m2} (ref >=60)
GFR, EST NON AFRICAN AMERICAN: 112 mL/min/{1.73_m2} (ref >=60)
Globulin: 4 g/dL (calc) — ABNORMAL HIGH (ref 1.9–3.7)
Glucose, Bld: 89 mg/dL (ref 65–99)
Potassium: 4.5 mmol/L (ref 3.5–5.3)
Sodium: 137 mmol/L (ref 135–146)
TOTAL PROTEIN: 8.2 g/dL — AB (ref 6.1–8.1)
Total Bilirubin: 0.4 mg/dL (ref 0.2–1.2)

## 2018-01-26 LAB — LIPID PANEL
Cholesterol: 187 mg/dL (ref <200)
HDL: 44 mg/dL — ABNORMAL LOW (ref >50)
LDL Cholesterol (Calc): 128 mg/dL (calc) — ABNORMAL HIGH
Non-HDL Cholesterol (Calc): 143 mg/dL (calc) — ABNORMAL HIGH (ref <130)
TRIGLYCERIDES: 61 mg/dL (ref <150)
Total CHOL/HDL Ratio: 4.3 (calc) (ref <5.0)

## 2018-01-26 LAB — RPR: RPR: NONREACTIVE

## 2018-01-26 LAB — HIV ANTIBODY (ROUTINE TESTING W REFLEX): HIV 1&2 Ab, 4th Generation: NONREACTIVE

## 2018-01-26 LAB — TSH: TSH: <0.01 mIU/L — ABNORMAL LOW

## 2018-01-26 LAB — VITAMIN D 25 HYDROXY (VIT D DEFICIENCY, FRACTURES): Vit D, 25-Hydroxy: 35 ng/mL (ref 30–100)

## 2018-01-27 LAB — PAP IG, CT-NG NAA, HPV HIGH-RISK
C. trachomatis RNA, TMA: NOT DETECTED
HPV DNA High Risk: NOT DETECTED
N. GONORRHOEAE RNA, TMA: NOT DETECTED

## 2018-01-30 ENCOUNTER — Telehealth: Payer: Self-pay | Admitting: Family Medicine

## 2018-01-30 NOTE — Telephone Encounter (Signed)
-----   Message from Garrison ColumbusSarah E Ellington, RN sent at 01/27/2018  3:33 PM EDT ----- Pt given lab results per notes of Maurice SmallEmily Boyce FNP on 01/26/18. Pt verbalized understanding. Called pt back to ask her to come in for lab. Pt stated that she will come in next Tuesday March 26.

## 2018-01-30 NOTE — Telephone Encounter (Signed)
That's good, Thank you

## 2018-01-31 LAB — THYROID PANEL WITH TSH
Free Thyroxine Index: 3.9 — ABNORMAL HIGH (ref 1.4–3.8)
T3 Uptake: 35 % (ref 22–35)
T4 TOTAL: 11.2 ug/dL (ref 5.1–11.9)

## 2018-02-06 ENCOUNTER — Other Ambulatory Visit: Payer: Self-pay | Admitting: Family Medicine

## 2018-02-07 ENCOUNTER — Ambulatory Visit: Payer: BLUE CROSS/BLUE SHIELD | Admitting: Gastroenterology

## 2018-03-21 ENCOUNTER — Ambulatory Visit: Payer: BLUE CROSS/BLUE SHIELD | Admitting: Gastroenterology

## 2018-03-27 ENCOUNTER — Encounter: Payer: Self-pay | Admitting: Nurse Practitioner

## 2018-03-27 ENCOUNTER — Ambulatory Visit (INDEPENDENT_AMBULATORY_CARE_PROVIDER_SITE_OTHER): Payer: BLUE CROSS/BLUE SHIELD | Admitting: Nurse Practitioner

## 2018-03-27 VITALS — BP 124/76 | HR 66 | Temp 97.9°F | Resp 16 | Ht 63.0 in | Wt 189.9 lb

## 2018-03-27 DIAGNOSIS — F320 Major depressive disorder, single episode, mild: Secondary | ICD-10-CM

## 2018-03-27 DIAGNOSIS — J069 Acute upper respiratory infection, unspecified: Secondary | ICD-10-CM | POA: Diagnosis not present

## 2018-03-27 MED ORDER — ESCITALOPRAM OXALATE 10 MG PO TABS: 10.0000 mg | ORAL_TABLET | Freq: Every day | ORAL | 1 refills | Status: DC

## 2018-03-27 MED ORDER — PROMETHAZINE-DM 6.25-15 MG/5ML PO SYRP: 2.5000 mL | ORAL_SOLUTION | Freq: Three times a day (TID) | ORAL | 0 refills | Status: DC

## 2018-03-27 NOTE — Progress Notes (Addendum)
Name: Donna Neal   MRN: [id number]    DOB: February 23, 1971   Date:03/27/2018       Progress Note  Subjective  Chief Complaint  Chief Complaint  Patient presents with  . Depression    2 month recheck, medication  . Follow-up    seen at [facility] 2 weeeks ago, still have cough, congestion    HPI  Depression Patient is taking lexapro  daily. States overall is feeling better- ran out of medications a few days ago- denies nausea, body aches. Medication has helped mood- states doesn't feel so down and have negative thoughts and feels she has overall better perspective. Has not tried counseling. Denies SI/HI  Depression screen [facility] 2/9 03/27/2018 03/27/2018 01/25/2018  Decreased Interest 0 0 0  Down, Depressed, Hopeless 0 - 0  PHQ - 2 Score 0 0 0  Altered sleeping 2 - 3  Tired, decreased energy 2 - 0  Change in appetite 0 - 0  Feeling bad or failure about yourself  0 - 0  Trouble concentrating 0 - 0  Moving slowly or fidgety/restless 0 - 0  Suicidal thoughts - - 0  PHQ-9 Score 4 - 3  Difficult doing work/chores Not difficult at all - Not difficult at all    URI Patient has had URI symptoms x 2 weeks. Went [facility]- dx sinus infection- given inhaler, coughing pills and antibiotic- broke out in rash, stopped taking it and called in new prescription. Sts coughing medicine doesn't help at all.  Pt sts has strong coughing episodes that cause side pain when coughing. Hoarse voice. Denies fevers/chills.    Patient Active Problem List   Diagnosis Date Noted  . Abnormal bowel movement 01/25/2018  . Fatigue 01/25/2018  . Current mild episode of major depressive disorder without prior episode (HCC) 01/25/2018  . Vitamin D deficiency disease 11/27/2015  . Intractable chronic migraine without aura and without status migrainosus 08/08/2015  . Obesity, Class II, BMI 35-39.9 07/08/2015    No past medical history on file.  Past Surgical History:  Procedure Laterality Date  . no past  surgery      Social History   Tobacco Use  . Smoking status: Never Smoker  . Smokeless tobacco: Never Used  Substance Use Topics  . Alcohol use: No     Current Outpatient Medications:  .  albuterol (PROVENTIL HFA) 108 (90 Base) MCG/ACT inhaler, 2 puffs q.i.d. p.r.n. short of breath, wheezing, or cough, Disp: , Rfl:  .  cholecalciferol (VITAMIN D) 1000 units tablet, Take 1,000 Units by mouth daily., Disp: , Rfl:  .  diphenhydrAMINE (BENADRYL) 25 mg capsule, Take 2 capsules (50 mg total) by mouth every 6 (six) hours as needed., Disp: 60 capsule, Rfl: 0 .  escitalopram (LEXAPRO) 10 MG tablet, Take 1 tablet (10 mg total) by mouth daily., Disp: 90 tablet, Rfl: 0  Allergies  Allergen Reactions  . Aspirin Hives  . Mucinex [Guaifenesin Er] Hives  . Penicillins Hives  . Zithromax [Azithromycin] Hives    ROS   No other specific complaints in a complete review of systems (except as listed in HPI above).  Objective  Vitals:   03/27/18 0821  BP: 124/76  Pulse: 66  Resp: 16  Temp: 97.9 F (36.6 C)  TempSrc: Oral  SpO2: 96%  Weight: 189 lb 14.4 oz (86.1 kg)  Height:  (1.6 m)     Body mass index is 33.64 kg/m.  Nursing Note and Vital Signs reviewed.  Physical  Exam   Constitutional: Patient appears well-developed and well-nourished. Obese No distress.  HEENT: head atraumatic, normocephalic, pupils equal and reactive to light, TM's without erythema or bulging,no maxillary or frontal sinus tenderness, neck supple without lymphadenopathy, oropharynx red and moist without exudate, no nasal discharge Cardiovascular: Normal rate, regular rhythm, S1/S2 present.  No murmur or rub heard.  Pulmonary/Chest: Effort normal and breath sounds clear. No respiratory distress or retractions. Coughing throughout assessment  Psychiatric: Patient has a normal mood and affect. behavior is normal. Judgment and thought content normal.  No results found for this or any previous visit (from  the past 72 hour(s)).  Assessment & Plan  1. Current mild episode of major depressive disorder without prior episode (HCC) - counseling  - escitalopram (LEXAPRO) 10 MG tablet; Take 1 tablet (10 mg total) by mouth daily.  Dispense: 90 tablet; Refill: 1  2. Upper respiratory tract infection, unspecified type -discussed OTC management  - promethazine-dextromethorphan (PROMETHAZINE-DM) 6.25-15 MG/5ML syrup; Take 2.5 mLs by mouth 3 (three) times daily.  Dispense: 118 mL; Refill: 0    -Red flags and when to present for emergency care or RTC including fever >101.73F, chest pain, shortness of breath, new/worsening/un-resolving symptoms, reviewed with patient at time of visit. Follow up and care instructions discussed and provided in AVS. ------------------------------------------- I have reviewed this encounter including the documentation in this note and/or discussed this patient with the provider, Sharyon Cable DNP AGNP-C. I am certifying that I agree with the content of this note as supervising physician. Baruch Gouty, MD [facility] 04/05/2018, 3:38 PM

## 2018-03-27 NOTE — Patient Instructions (Addendum)
-   Recommend counseling in addition to medication to help assist with healthy mindset and perspective. You can go the following website, type in your zip-code and choose a counselor off the list: www.psychologytoday.com  - I am sending you a cough syrup- to take up to three time a day. It normally makes people drowsy- so no driving after taking it.  It is important that you drink plenty of fluids, rest. Cover your nose/mouth when you cough or sneeze and wash your hands well and often. Here are some helpful things you can use or pick up over the counter from the pharmacy to help with your symptoms:   For Fever/Pain: Acetaminophen every 6 hours as needed (maximum of  a day). If you are still uncomfortable you can add ibuprofen OR naproxen  For coughing: try dextromethorphan for a cough suppressant, and/or a cool mist humidifier, lozenges  For sore throat: saline gargles, honey herbal tea, lozenges, throat spray  To dry out your nose: try an antihistamine like loratadine (non-sedating) or diphenhydramine (sedating) or others To relieve a stuffy nose: try an oral decongestant  Like pseudoephedrine if you are under the age of 39 and do not have high blood pressure, neti pot To make blowing your nose easier: guaifenesin

## 2018-04-12 ENCOUNTER — Ambulatory Visit: Payer: BLUE CROSS/BLUE SHIELD | Admitting: Gastroenterology

## 2018-04-18 DIAGNOSIS — E0590 Thyrotoxicosis, unspecified without thyrotoxic crisis or storm: Secondary | ICD-10-CM | POA: Insufficient documentation

## 2018-05-31 ENCOUNTER — Ambulatory Visit: Payer: BLUE CROSS/BLUE SHIELD | Admitting: Gastroenterology

## 2018-05-31 ENCOUNTER — Encounter: Payer: Self-pay | Admitting: Gastroenterology

## 2018-06-27 ENCOUNTER — Ambulatory Visit: Payer: BLUE CROSS/BLUE SHIELD | Admitting: Family Medicine

## 2018-07-04 ENCOUNTER — Ambulatory Visit: Payer: BLUE CROSS/BLUE SHIELD | Admitting: Family Medicine

## 2018-07-04 ENCOUNTER — Encounter: Payer: Self-pay | Admitting: Family Medicine

## 2018-07-04 VITALS — BP 130/84 | HR 68 | Temp 98.7°F | Resp 16 | Ht 63.0 in | Wt 189.0 lb

## 2018-07-04 DIAGNOSIS — G43719 Chronic migraine without aura, intractable, without status migrainosus: Secondary | ICD-10-CM | POA: Diagnosis not present

## 2018-07-04 DIAGNOSIS — F320 Major depressive disorder, single episode, mild: Secondary | ICD-10-CM | POA: Diagnosis not present

## 2018-07-04 DIAGNOSIS — E669 Obesity, unspecified: Secondary | ICD-10-CM

## 2018-07-04 DIAGNOSIS — E559 Vitamin D deficiency, unspecified: Secondary | ICD-10-CM | POA: Diagnosis not present

## 2018-07-04 MED ORDER — ESCITALOPRAM OXALATE 20 MG PO TABS: 20.0000 mg | ORAL_TABLET | Freq: Every day | ORAL | 0 refills | Status: DC

## 2018-07-04 NOTE — Patient Instructions (Addendum)
-   Take 1.5 tablets (15mg ) of the 10mg  tablets for 3-4 days, then switch to taking 1 tablet of the 20mg  tablets daily.  - Your LDL is above normal.  The LDL is the "lousy" or bad cholesterol. Over time and in combination with inflammation and other factors, this contributes to plaque which in turn may lead to stroke and/or heart attack down the road.  Sometimes high LDL is primarily genetic, and people might be eating all the right foods but still have high numbers.  Other times, there is room for improvement in one's diet and eating healthier can bring this number down and potentially reduce one's risk of heart attack and/or stroke.  Your LDL level should be below 161100. If you have diabetes or a possible heart problem, your LDL should be below 70.  Some strategies to focus on to help improve your LDL levels:  - Eat 20 to 30 grams of fiber every day.  - Eat Foods such as fruits and vegetables, whole grains, beans, peas, nuts, and seeds can help lower LDL. - Avoid Saturated fats - Dairy foods - such as butter, cream, ghee, regular-fat milk and cheese. Meat - such as fatty cuts of beef, pork and lamb, processed meats like salami, sausages and the skin on chicken. Lard., fatty snack foods, cakes, biscuits, pies and deep fried foods) - Avoid smoking  - HDL removes extra cholesterol and plaque buildup in your arteries and then sends it to your liver to remove it from your body; this helps reduce your risk of heart disease, heart attack, and stroke.  Foods that increase HDL include beans and legumes, whole grains, high-fiber fruits:prunes, apples, and pears; fatty fish- salmon, tuna, sardines; nuts, olive oil.

## 2018-07-04 NOTE — Progress Notes (Signed)
Name: Donna Neal   MRN: [id number]    DOB: 1970/12/18   Date:07/04/2018       Progress Note  Subjective  Chief Complaint  Chief Complaint  Patient presents with  . Follow-up    3 month recheck    HPI  Pt presents for follow up:  Depression: She notes increased stress lately - her [facility] for severe abdominal issues with multiple surgeries.  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  She is taking Lexapro 10mg .  PHQ-9 score of 6 today which is a slight increase from before.  She has done counseling in the past, does not have time right now. Denies SI/HI, or panic attacks.  Migraines: Stress and not eating are major triggers for her - she skips meals at work because she doesn't like the food; she also has increased stress lately (see above discussion).  Discussed bringing food from home, but she has trouble finding time at work to eat.  She takes extra strength Excedrin without aspirin - and this takes care of the pain.  She has about 2 migraines a month.   Hyperthyroid: She was referred to endocrinology is March 2019 - she saw Dr. Gershon Crane'Connell with endocrinology in June 2019 and they proceeded with medication management - taking methimazole  Vitamin D Deficiency: Taking supplement daily.  Obesity: Body mass index is 33.48 kg/m. Weight Management History: Diet: Skips meals frequently, trying to eat more fruit, grilled chicken and fish, eating more salads.  Does not eat out, cooks at home.  Exercise: not active, but is trying to start walking more frequently. Co-Morbid Conditions: none; 2 or more of these conditions combined with BMI >30 is considered morbid obesity; is this diagnosis appropriate and/or added to patient's problem list? No   Patient Active Problem List   Diagnosis Date Noted  . Abnormal bowel movement 01/25/2018  . Fatigue 01/25/2018  . Current mild episode of  major depressive disorder without prior episode (HCC) 01/25/2018  . Vitamin D deficiency disease 11/27/2015  . Intractable chronic migraine without aura and without status migrainosus 08/08/2015  . Obesity, Class II, BMI 35-39.9 07/08/2015    Past Surgical History:  Procedure Laterality Date  . no past surgery      Family History  Problem Relation Age of Onset  . Hypertension Mother   . Hypertension Brother   . Hypertension Maternal Aunt   . Cancer Maternal Uncle   . Cancer Maternal Grandmother     Social History   Socioeconomic History  . Marital status: Single    Spouse name: Not on file  . Number of children: Not on file  . Years of education: Not on file  . Highest education level: Not on file  Occupational History  . Not on file  Social Needs  . Financial resource strain: Not on file  . Food insecurity:    Worry: Not on file    Inability: Not on file  . Transportation needs:    Medical: Not on file    Non-medical: Not on file  Tobacco Use  . Smoking status: Never Smoker  . Smokeless tobacco: Never Used  Substance and Sexual Activity  . Alcohol use: No  . Drug use: No  . Sexual activity: Yes    Birth control/protection: Condom  Lifestyle  . Physical activity:    Days per week: Not  on file    Minutes per session: Not on file  . Stress: Not on file  Relationships  . Social connections:    Talks on phone: Not on file    Gets together: Not on file    Attends religious service: Not on file    Active member of club or organization: Not on file    Attends meetings of clubs or organizations: Not on file    Relationship status: Not on file  . Intimate partner violence:    Fear of current or ex partner: Not on file    Emotionally abused: Not on file    Physically abused: Not on file    Forced sexual activity: Not on file  Other Topics Concern  . Not on file  Social History Narrative  . Not on file     Current Outpatient Medications:  .  albuterol  (PROVENTIL HFA) 108 (90 Base) MCG/ACT inhaler, 2 puffs q.i.d. p.r.n. short of breath, wheezing, or cough, Disp: , Rfl:  .  cholecalciferol (VITAMIN D) 1000 units tablet, Take 1,000 Units by mouth daily., Disp: , Rfl:  .  diphenhydrAMINE (BENADRYL) 25 mg capsule, Take 2 capsules (50 mg total) by mouth every 6 (six) hours as needed., Disp: 60 capsule, Rfl: 0 .  escitalopram (LEXAPRO) 10 MG tablet, Take 1 tablet (10 mg total) by mouth daily., Disp: 90 tablet, Rfl: 1 .  methimazole (TAPAZOLE) 10 MG tablet, Take by mouth., Disp: , Rfl:  .  promethazine-dextromethorphan (PROMETHAZINE-DM) 6.25-15 MG/5ML syrup, Take 2.5 mLs by mouth 3 (three) times daily. (Patient not taking: Reported on 07/04/2018), Disp: 118 mL, Rfl: 0  Allergies  Allergen Reactions  . Aspirin Hives  . Mucinex [Guaifenesin Er] Hives  . Nsaids Hives  . Penicillins Hives  . Zithromax [Azithromycin] Hives    ROS  Constitutional: Negative for fever or weight change.  Respiratory: Negative for cough and shortness of breath.   Cardiovascular: Negative for chest pain or palpitations.  Gastrointestinal: Negative for abdominal pain, no bowel changes.  Musculoskeletal: Negative for gait problem or joint swelling.  Skin: Negative for rash.  Neurological: Negative for dizziness or headache.  No other specific complaints in a complete review of systems (except as listed in HPI above).  Objective  Vitals:   07/04/18 1006  BP: 130/84  Pulse: 68  Resp: 16  Temp: 98.7 F (37.1 C)  TempSrc: Oral  SpO2: 99%  Weight: 189 lb (85.7 kg)  Height: 5\' 3"  (1.6 m)   Body mass index is 33.48 kg/m.  Physical Exam Constitutional: Patient appears well-developed and well-nourished. No distress.  HENT: Head: Normocephalic and atraumatic. Nose: Nose normal. Mouth/Throat: Oropharynx is clear and moist. No oropharyngeal exudate.  Eyes: Conjunctivae and EOM are normal. Pupils are equal, round, and reactive to light. No scleral icterus.  Neck:  Normal range of motion. Neck supple. No JVD present. No thyromegaly present.  Cardiovascular: Normal rate, regular rhythm and normal heart sounds.  No murmur heard. No BLE edema. Pulmonary/Chest: Effort normal and breath sounds normal. No respiratory distress. Abdominal: Soft. Bowel sounds are normal, no distension. There is no tenderness. no masses Musculoskeletal: Normal range of motion, no joint effusions. No gross deformities Neurological: she is alert and oriented to person, place, and time. No cranial nerve deficit. Coordination, balance, strength, speech and gait are normal.  Skin: Skin is warm and dry. No rash noted. No erythema.  Psychiatric: Patient has a normal mood and affect. behavior is normal. Judgment and thought content normal.  No results found for this or any previous visit (from the past 72 hour(s)).  PHQ2/9: Depression screen [facility] 2/9 07/04/2018 03/27/2018 03/27/2018 01/25/2018 01/11/2018  Decreased Interest 0 0 0 0 1  Down, Depressed, Hopeless 0 0 - 0 0  PHQ - 2 Score 0 0 0 0 1  Altered sleeping 2 2 - 3 2  Tired, decreased energy 2 2 - 0 2  Change in appetite 0 0 - 0 0  Feeling bad or failure about yourself  0 0 - 0 1  Trouble concentrating 0 0 - 0 0  Moving slowly or fidgety/restless 2 0 - 0 0  Suicidal thoughts 0 - - 0 0  PHQ-9 Score 6 4 - 3 6  Difficult doing work/chores Somewhat difficult Not difficult at all - Not difficult at all Not difficult at all   Fall Risk: Fall Risk  07/04/2018 01/11/2018  Falls in the past year? No No   Assessment & Plan  1. Current mild episode of major depressive disorder without prior episode (HCC) - escitalopram (LEXAPRO) 20 MG tablet; Take 1 tablet (20 mg total) by mouth daily.  Dispense: 90 tablet; Refill: 0 - We will increase dose today; she is under increased stress, looking for a new job to help with this. Discussed stress management.    2. Intractable chronic migraine without aura and without status migrainosus - Stable with  excedrin OTC - will consider triptan in the future if this is not working  3. Obesity, Class II, BMI 35-39.9 - Discussed importance of 150 minutes of physical activity weekly, eat two servings of fish weekly, eat one serving of tree nuts ( cashews, pistachios, pecans, almonds.Marland Kitchen) every other day, eat 6 servings of fruit/vegetables daily and drink plenty of water and avoid sweet beverages.  - Discussed dietary management of lipids - see AVS.  4. Vitamin D deficiency disease - Continue supplementation.

## 2018-07-05 ENCOUNTER — Ambulatory Visit: Payer: BLUE CROSS/BLUE SHIELD | Admitting: Gastroenterology

## 2018-07-05 ENCOUNTER — Encounter: Payer: Self-pay | Admitting: Gastroenterology

## 2018-07-05 ENCOUNTER — Other Ambulatory Visit: Payer: Self-pay

## 2018-07-05 VITALS — BP 144/77 | HR 80 | Ht 63.0 in | Wt 190.2 lb

## 2018-07-05 DIAGNOSIS — Z1211 Encounter for screening for malignant neoplasm of colon: Secondary | ICD-10-CM

## 2018-07-05 DIAGNOSIS — R12 Heartburn: Secondary | ICD-10-CM

## 2018-07-05 DIAGNOSIS — R1310 Dysphagia, unspecified: Secondary | ICD-10-CM

## 2018-07-05 NOTE — Patient Instructions (Signed)
F/U 3-6 months

## 2018-07-06 NOTE — Progress Notes (Signed)
Donna BouillonVarnita Afreen Neal [address]  [address]  [place], [place] [address]  Main: [phone]  Fax: [phone]   Gastroenterology Consultation  Referring Provider:     Cheryle Neal, Donna E, NP Primary Care Physician:  Donna Neal, Donna E, FNP Primary Gastroenterologist:  Dr. Melodie BouillonVarnita Kameela Neal Reason for Consultation:   Altered bowel habits        HPI:    Chief Complaint  Patient presents with  . New Patient (Initial Visit)    COLON CANCER SCREEN, ABNORMAL BOWEL MOVEMENT-CONSTIPATION     Donna Neal is a 47 y.o. y/o female referred for consultation & management  by Dr. Annye AsaBoyce, Gerome ApleyEmily E, FNP.  Patient describes episodes of nausea vomiting and diarrhea about 3 to 4 months ago.  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  Those symptoms are completely resolved at that at this time, but states that the diarrhea lasted a long time.  Was having 4-6 loose bowel movements a day that lasted for 4 to 6 weeks.  Had 2 to 3-day history of nausea when symptoms started.  No hematemesis.  No stool studies testing available from that time.  No fever or chills.  No blood in stool.  No weight loss.  No family history of colon cancer.  Also reports daily heartburn 2-3 times a day.  Reports intermittent dysphagia to pills only.  No episodes of food impaction.  History reviewed. No pertinent past medical history.  Past Surgical History:  Procedure Laterality Date  . no past surgery      Prior to Admission medications   Medication Sig Start Date End Date Taking? Authorizing Provider  albuterol (PROVENTIL HFA) 108 (90 Base) MCG/ACT inhaler 2 puffs q.i.d. p.r.n. short of breath, wheezing, or cough 03/13/18  Yes [provider]  cholecalciferol (VITAMIN D) 1000 units tablet Take 1,000 Units by mouth daily.   Yes [provider]  diphenhydrAMINE (BENADRYL) 25 mg capsule Take 2 capsules (50 mg total) by mouth every 6 (six) hours as  needed. 06/17/15  Yes Sharman CheekStafford, Phillip, MD  escitalopram (LEXAPRO) 20 MG tablet Take 1 tablet (20 mg total) by mouth daily. 07/04/18  Yes Donna Neal, Donna E, FNP  methimazole (TAPAZOLE) 10 MG tablet Take by mouth. 04/19/18 04/19/19 Yes [provider]    Family History  Problem Relation Age of Onset  . Hypertension Mother   . Hypertension Brother   . Hypertension Maternal Aunt   . Cancer Maternal Uncle   . Cancer Maternal Grandmother      Social History   Tobacco Use  . Smoking status: Never Smoker  . Smokeless tobacco: Never Used  Substance Use Topics  . Alcohol use: No  . Drug use: No    Allergies as of 07/05/2018 - Review Complete 07/05/2018  Allergen Reaction Noted  . Aspirin Hives 06/17/2015  . Mucinex [guaifenesin er] Hives 06/17/2015  . Nsaids Hives 03/27/2018  . Penicillins Hives 06/17/2015  . Zithromax [azithromycin] Hives 06/17/2015    Review of Systems:    All systems reviewed and negative except where noted in HPI.   Physical Exam:  BP (!) 144/77   Pulse 80   Ht 5\' 3"  (1.6 m)   Wt 190 lb 3.2 oz (86.3 kg)   LMP 07/07/2015 (Approximate)   BMI 33.69 kg/m  Patient's last menstrual period was 07/07/2015 (approximate). Psych:  Alert and cooperative. Normal mood and affect. General:   Alert,  Well-developed, well-nourished, pleasant and  cooperative in NAD Head:  Normocephalic and atraumatic. Eyes:  Sclera clear, no icterus.   Conjunctiva pink. Ears:  Normal auditory acuity. Nose:  No deformity, discharge, or lesions. Mouth:  No deformity or lesions,oropharynx pink & moist. Neck:  Supple; no masses or thyromegaly. Lungs:  Respirations even and unlabored.  Clear throughout to auscultation.   No wheezes, crackles, or rhonchi. No acute distress. Heart:  Regular rate and rhythm; no murmurs, clicks, rubs, or gallops. Abdomen:  Normal bowel sounds.  No bruits.  Soft, non-tender and non-distended without masses, hepatosplenomegaly or hernias noted.  No guarding or  rebound tenderness.    Msk:  Symmetrical without gross deformities. Good, equal movement & strength bilaterally. Pulses:  Normal pulses noted. Extremities:  No clubbing or edema.  No cyanosis. Neurologic:  Alert and oriented x3;  grossly normal neurologically. Skin:  Intact without significant lesions or rashes. No jaundice. Lymph Nodes:  No significant cervical adenopathy. Psych:  Alert and cooperative. Normal mood and affect.   Labs: CBC No results found for: WBC, RBC, HGB, HCT, PLT, MCV, MCH, MCHC, RDW, LYMPHSABS, MONOABS, EOSABS, BASOSABS CMP     Component Value Date/Time   NA 137 01/25/2018 0856   K 4.5 01/25/2018 0856   CL 103 01/25/2018 0856   CO2 29 01/25/2018 0856   GLUCOSE 89 01/25/2018 0856   BUN 11 01/25/2018 0856   CREATININE 0.54 01/25/2018 0856   CALCIUM 9.4 01/25/2018 0856   PROT 8.2 (H) 01/25/2018 0856   AST 19 01/25/2018 0856   ALT 17 01/25/2018 0856   BILITOT 0.4 01/25/2018 0856   GFRNONAA 112 01/25/2018 0856   GFRAA 129 01/25/2018 0856    Imaging Studies: No results found.  Assessment and Plan:   Donna Neal is a 47 y.o. y/o female has been referred for episode of nausea vomiting and diarrhea that occurred 3 to 4 months ago and has completely resolved at this time  Altered bowel habits in the past were likely due to viral gastroenteritis that has resolved Oral and hand hygiene discussed in detail Patient due for screening colonoscopy, will schedule.  In addition, given patient's heartburn, and intermittent dysphagia to pills, will evaluate with EGD to rule out any underlying strictures, and EOE I have discussed alternative options, risks & benefits,  which include, but are not limited to, bleeding, infection, perforation,respiratory complication & drug reaction.  The patient agrees with this plan & written consent will be obtained.    Patient does not have a bowel movement every day and strains with bowel movements at times High-fiber  diet MiraLAX daily with goal of 1-2 soft bowel movements daily.  If not at goal, patient instructed to increase dose to twice daily.  If loose stools with the medication, patient asked to decrease the medication to every other day, or half dose daily.  Patient verbalized understanding  Patient educated extensively on acid reflux lifestyle modification, including buying a bed wedge, not eating 3 hrs before bedtime, diet modifications, and handout given for the same.   Reports taking over-the-counter antacids that helped somewhat.  Can change medications based on EGD findings.   Dr Donna Neal

## 2018-07-28 ENCOUNTER — Encounter: Payer: Self-pay | Admitting: *Deleted

## 2018-07-28 ENCOUNTER — Ambulatory Visit
Admission: RE | Admit: 2018-07-28 | Discharge: 2018-07-28 | Disposition: A | Discharge status: Home / Self Care | Payer: BLUE CROSS/BLUE SHIELD | Source: Ambulatory Visit | Attending: Gastroenterology | Admitting: Gastroenterology

## 2018-07-28 ENCOUNTER — Ambulatory Visit: Payer: BLUE CROSS/BLUE SHIELD | Admitting: Anesthesiology

## 2018-07-28 ENCOUNTER — Encounter
Admission: RE | Disposition: A | Discharge status: Home / Self Care | Payer: Self-pay | Source: Ambulatory Visit | Attending: Gastroenterology

## 2018-07-28 DIAGNOSIS — R112 Nausea with vomiting, unspecified: Secondary | ICD-10-CM | POA: Diagnosis not present

## 2018-07-28 DIAGNOSIS — R1310 Dysphagia, unspecified: Secondary | ICD-10-CM | POA: Insufficient documentation

## 2018-07-28 DIAGNOSIS — K5730 Diverticulosis of large intestine without perforation or abscess without bleeding: Secondary | ICD-10-CM | POA: Insufficient documentation

## 2018-07-28 DIAGNOSIS — Z880 Allergy status to penicillin: Secondary | ICD-10-CM | POA: Diagnosis not present

## 2018-07-28 DIAGNOSIS — R12 Heartburn: Secondary | ICD-10-CM

## 2018-07-28 DIAGNOSIS — Z1211 Encounter for screening for malignant neoplasm of colon: Secondary | ICD-10-CM

## 2018-07-28 DIAGNOSIS — Z886 Allergy status to analgesic agent status: Secondary | ICD-10-CM | POA: Insufficient documentation

## 2018-07-28 DIAGNOSIS — E039 Hypothyroidism, unspecified: Secondary | ICD-10-CM | POA: Insufficient documentation

## 2018-07-28 DIAGNOSIS — K219 Gastro-esophageal reflux disease without esophagitis: Secondary | ICD-10-CM | POA: Insufficient documentation

## 2018-07-28 HISTORY — PX: COLONOSCOPY WITH PROPOFOL: SHX5780

## 2018-07-28 HISTORY — DX: Major depressive disorder, single episode, unspecified: F32.9

## 2018-07-28 HISTORY — DX: Anxiety disorder, unspecified: F41.9

## 2018-07-28 HISTORY — DX: Hypothyroidism, unspecified: E03.9

## 2018-07-28 HISTORY — DX: Depression, unspecified: F32.A

## 2018-07-28 HISTORY — PX: ESOPHAGOGASTRODUODENOSCOPY (EGD) WITH PROPOFOL: SHX5813

## 2018-07-28 SURGERY — COLONOSCOPY WITH PROPOFOL
Anesthesia: General

## 2018-07-28 MED ORDER — LIDOCAINE HCL (CARDIAC) PF 100 MG/5ML IV SOSY
PREFILLED_SYRINGE | INTRAVENOUS | Status: DC | PRN
  Administered 2018-07-28: 80 mg via INTRAVENOUS

## 2018-07-28 MED ORDER — PROPOFOL 500 MG/50ML IV EMUL
INTRAVENOUS | Status: AC
  Filled 2018-07-28: qty 50

## 2018-07-28 MED ORDER — LIDOCAINE HCL (PF) 2 % IJ SOLN
INTRAMUSCULAR | Status: AC
  Filled 2018-07-28: qty 10

## 2018-07-28 MED ORDER — GLYCOPYRROLATE 0.2 MG/ML IJ SOLN
INTRAMUSCULAR | Status: DC | PRN
  Administered 2018-07-28: 0.2 mg via INTRAVENOUS

## 2018-07-28 MED ORDER — PROPOFOL 10 MG/ML IV BOLUS
INTRAVENOUS | Status: AC
  Filled 2018-07-28: qty 20

## 2018-07-28 MED ORDER — SODIUM CHLORIDE 0.9 % IV SOLN
INTRAVENOUS | Status: DC
  Administered 2018-07-28: 1000 mL via INTRAVENOUS

## 2018-07-28 MED ORDER — GLYCOPYRROLATE 0.2 MG/ML IJ SOLN
INTRAMUSCULAR | Status: AC
  Filled 2018-07-28: qty 1

## 2018-07-28 MED ORDER — PROPOFOL 500 MG/50ML IV EMUL
INTRAVENOUS | Status: DC | PRN
  Administered 2018-07-28: 150 ug/kg/min via INTRAVENOUS

## 2018-07-28 NOTE — Op Note (Signed)
[facility] Gastroenterology Patient Name: Donna Neal Procedure Date: 07/28/2018 9:05 AM MRN: [id number] Account #: [id number] Date of Birth: 1971-10-11 Admit Type: Outpatient Age: 47 Room: [facility] ENDO ROOM 2 Gender: Female Note Status: Finalized Procedure:            Upper GI endoscopy Indications:          Dysphagia, Nausea with vomiting Providers:            Demarri Elie B. Maximino Greenland MD, MD Medicines:            Monitored Anesthesia Care Complications:        No immediate complications. Procedure:            Pre-Anesthesia Assessment:                       - Prior to the procedure, a History and Physical was                        performed, and patient medications, allergies and                        sensitivities were reviewed. The patient's tolerance of                        previous anesthesia was reviewed.                       - The risks and benefits of the procedure and the                        sedation options and risks were discussed with the                        patient. All questions were answered and informed                        consent was obtained.                       - Patient identification and proposed procedure were                        verified prior to the procedure by the physician, the                        nurse, the anesthesiologist, the anesthetist and the                        technician. The procedure was verified in the procedure                        room.                       - ASA Grade Assessment: II - A patient with mild                        systemic disease.                       After obtaining informed consent, the endoscope was  passed under direct vision. Throughout the procedure,                        the patient's blood pressure, pulse, and oxygen                        saturations were monitored continuously. The Endoscope                        was introduced through the mouth, and  advanced to the                        second part of duodenum. The Endoscope was introduced                        through the mouth, and advanced to the second part of                        duodenum. The upper GI endoscopy was accomplished with                        ease. The patient tolerated the procedure well. Findings:      The examined esophagus was normal. Biopsies were obtained from the       proximal and distal esophagus with cold forceps for histology of       suspected eosinophilic esophagitis.      The entire examined stomach was normal. Biopsies were taken with a cold       forceps for histology.      The duodenal bulb, second portion of the duodenum and examined duodenum       were normal. Impression:           - Normal esophagus. Biopsied.                       - Normal stomach. Biopsied.                       - Normal duodenal bulb, second portion of the duodenum                        and examined duodenum. Recommendation:       - Discharge patient to home (with escort).                       - Advance diet as tolerated.                       - Continue present medications.                       - Patient has a contact number available for                        emergencies. The signs and symptoms of potential                        delayed complications were discussed with the patient.                        Return to normal activities tomorrow. Written discharge  instructions were provided to the patient.                       - Discharge patient to home (with escort).                       - The findings and recommendations were discussed with                        the patient.                       - The findings and recommendations were discussed with                        the patient's family. Procedure Code(s):    --- Professional ---                       [phone], Esophagogastroduodenoscopy, flexible, transoral;                         with biopsy, single or multiple Diagnosis Code(s):    --- Professional ---                       R13.10, Dysphagia, unspecified                       R11.2, Nausea with vomiting, unspecified CPT copyright 2017 American Medical Association. All rights reserved. The codes documented in this report are preliminary and upon coder review may  be revised to meet current compliance requirements.  Melodie Bouillon, MD Michel Bickers B. Maximino Greenland MD, MD 07/28/2018 9:43:59 AM This report has been signed electronically. Number of Addenda: 0 Note Initiated On: 07/28/2018 9:05 AM Estimated Blood Loss: Estimated blood loss: none.      [facility]

## 2018-07-28 NOTE — H&P (Signed)
Melodie BouillonVarnita Thelma Viana, MD [address], [address], [place], [place], [address] [address], [address], [place], [place], [address] Phone: [phone]  Fax: [phone]  Primary Care Physician:  Doren CustardBoyce, Emily E, FNP   Pre-Procedure History & Physical: HPI:  Donna Neal is a 47 y.o. female is here for a colonoscopy and EGD.   Past Medical History:  Diagnosis Date  . Anxiety   . Depression   . Thyroid activity decreased     Past Surgical History:  Procedure Laterality Date  . NO PAST SURGERIES    . no past surgery      Prior to Admission medications   Medication Sig Start Date End Date Taking? Authorizing Provider  albuterol (PROVENTIL HFA) 108 (90 Base) MCG/ACT inhaler 2 puffs q.i.d. p.r.n. short of breath, wheezing, or cough 03/13/18   [provider]  cholecalciferol (VITAMIN D) 1000 units tablet Take 1,000 Units by mouth daily.    [provider]  diphenhydrAMINE (BENADRYL) 25 mg capsule Take 2 capsules (50 mg total) by mouth every 6 (six) hours as needed. 06/17/15   Sharman CheekStafford, Phillip, MD  escitalopram (LEXAPRO) 20 MG tablet Take 1 tablet (20 mg total) by mouth daily. 07/04/18   Doren CustardBoyce, Emily E, FNP  methimazole (TAPAZOLE) 10 MG tablet Take by mouth. 04/19/18 04/19/19  [provider]    Allergies as of 07/05/2018 - Review Complete 07/05/2018  Allergen Reaction Noted  . Aspirin Hives 06/17/2015  . Mucinex [guaifenesin er] Hives 06/17/2015  . Nsaids Hives 03/27/2018  . Penicillins Hives 06/17/2015  . Zithromax [azithromycin] Hives 06/17/2015    Family History  Problem Relation Age of Onset  . Hypertension Mother   . Hypertension Brother   . Hypertension Maternal Aunt   . Cancer Maternal Uncle   . Cancer Maternal Grandmother     Social History   Socioeconomic History  . Marital status: Single    Spouse name: Not on file  . Number of children: Not on file  . Years of education: Not on file  . Highest education level: Not on file    Occupational History  . Not on file  Social Needs  . Financial resource strain: Not on file  . Food insecurity:    Worry: Not on file    Inability: Not on file  . Transportation needs:    Medical: Not on file    Non-medical: Not on file  Tobacco Use  . Smoking status: Never Smoker  . Smokeless tobacco: Never Used  Substance and Sexual Activity  . Alcohol use: No  . Drug use: No  . Sexual activity: Yes    Birth control/protection: Condom  Lifestyle  . Physical activity:    Days per week: Not on file    Minutes per session: Not on file  . Stress: Not on file  Relationships  . Social connections:    Talks on phone: Not on file    Gets together: Not on file    Attends religious service: Not on file    Active member of club or organization: Not on file    Attends meetings of clubs or organizations: Not on file    Relationship status: Not on file  . Intimate partner violence:    Fear of current or ex partner: Not on file    Emotionally abused: Not on file    Physically abused: Not on file    Forced sexual activity: Not on file  Other Topics Concern  . Not on file  Social History Narrative  . Not on file    Review of Systems: See HPI, otherwise negative ROS  Physical Exam: LMP 07/07/2015 (Approximate)  General:   Alert,  pleasant and cooperative in NAD Head:  Normocephalic and atraumatic. Neck:  Supple; no masses or thyromegaly. Lungs:  Clear throughout to auscultation, normal respiratory effort.    Heart:  +S1, +S2, Regular rate and rhythm, No edema. Abdomen:  Soft, nontender and nondistended. Normal bowel sounds, without guarding, and without rebound.   Neurologic:  Alert and  oriented x4;  grossly normal neurologically.  Impression/Plan: Donna Neal is here for a colonoscopy to be performed for average risk screening and EGD for intermittent dysphagia.  Risks, benefits, limitations, and alternatives regarding  colonoscopy have been reviewed with the patient.   Questions have been answered.  All parties agreeable.   Pasty Spillers, MD  07/28/2018, 9:04 AM

## 2018-07-28 NOTE — Anesthesia Postprocedure Evaluation (Signed)
Anesthesia Post Note  Patient: Donna Neal  Procedure(s) Performed: COLONOSCOPY WITH PROPOFOL (N/A ) ESOPHAGOGASTRODUODENOSCOPY (EGD) WITH PROPOFOL (N/A )  Patient location during evaluation: Endoscopy Anesthesia Type: General Level of consciousness: awake and alert Pain management: pain level controlled Vital Signs Assessment: post-procedure vital signs reviewed and stable Respiratory status: spontaneous breathing, nonlabored ventilation, respiratory function stable and patient connected to nasal cannula oxygen Cardiovascular status: blood pressure returned to baseline and stable Postop Assessment: no apparent nausea or vomiting Anesthetic complications: no     Last Vitals:  Vitals:   07/28/18 1035 07/28/18 1045  BP: (!) 114/96 (!) 144/74  Pulse: 65 64  Resp: 11 13  Temp:    SpO2: 100% 100%    Last Pain:  Vitals:   07/28/18 1045  TempSrc:   PainSc: 0-No pain                 Lenard SimmerAndrew Kaenan Jake

## 2018-07-28 NOTE — Anesthesia Post-op Follow-up Note (Signed)
Anesthesia QCDR form completed.        

## 2018-07-28 NOTE — Op Note (Signed)
[facility]astroenterology Patient Name: Donna Neal Procedure Date: 07/28/2018 9:05 AM MRN: [id number] Account #: [id number] Date of Birth: June 16, 1971 Admit Type: Outpatient Age: 47 Room: [facility] ENDO ROOM 2 Gender: Female Note Status: Finalized Procedure:            Colonoscopy Indications:          Screening for colorectal malignant neoplasm Providers:            Autry Prust B. Maximino Greenland MD, MD Medicines:            Monitored Anesthesia Care Complications:        No immediate complications. Procedure:            Pre-Anesthesia Assessment:                       - After reviewing the risks and benefits, the patient                        was deemed in satisfactory condition to undergo the                        procedure.                       - Prior to the procedure, a History and Physical was                        performed, and patient medications, allergies and                        sensitivities were reviewed. The patient's tolerance of                        previous anesthesia was reviewed.                       - The risks and benefits of the procedure and the                        sedation options and risks were discussed with the                        patient. All questions were answered and informed                        consent was obtained.                       - Patient identification and proposed procedure were                        verified prior to the procedure by the physician, the                        nurse, the anesthesiologist, the anesthetist and the                        technician. The procedure was verified in the procedure                        room.                       -  ASA Grade Assessment: II - A patient with mild                        systemic disease.                       After obtaining informed consent, the colonoscope was                        passed under direct vision. Throughout the procedure,         the patient's blood pressure, pulse, and oxygen                        saturations were monitored continuously. The                        Colonoscope was introduced through the anus and                        advanced to the the cecum, identified by appendiceal                        orifice and ileocecal valve. The colonoscopy was                        performed with ease. The patient tolerated the                        procedure well. The quality of the bowel preparation                        was fair. Findings:      The perianal and digital rectal examinations were normal.      Multiple diverticula were found in the sigmoid colon.      The exam was otherwise without abnormality.      The rectum, sigmoid colon, descending colon, transverse colon, ascending       colon and cecum appeared normal.      The retroflexed view of the distal rectum and anal verge was normal and       showed no anal or rectal abnormalities. Impression:           - Preparation of the colon was fair.                       - Diverticulosis in the sigmoid colon.                       - The examination was otherwise normal.                       - The rectum, sigmoid colon, descending colon,                        transverse colon, ascending colon and cecum are normal.                       - The distal rectum and anal verge are normal on                        retroflexion view.                       -  No specimens collected. Recommendation:       - Discharge patient to home.                       - Resume previous diet.                       - Continue present medications.                       - Repeat colonoscopy in 3 years for screening purposes                        with 2 day prep due to fair prep on today's exam (Water                        and suctioning used to clear the stool and improve                        visualization).                       - Return to primary care physician as  previously                        scheduled.                       - The findings and recommendations were discussed with                        the patient.                       - The findings and recommendations were discussed with                        the patient's family.                       - High fiber diet. Procedure Code(s):    --- Professional ---                       Z6109, Colorectal cancer screening; colonoscopy on                        individual not meeting criteria for high risk Diagnosis Code(s):    --- Professional ---                       Z12.11, Encounter for screening for malignant neoplasm                        of colon                       K57.30, Diverticulosis of large intestine without                        perforation or abscess without bleeding CPT copyright 2017 American Medical Association. All rights reserved. The codes documented in this report are preliminary and upon coder review may  be revised to meet current compliance requirements.  Melodie Bouillon, MD Michel Bickers  Ralene Bathe MD, MD 07/28/2018 10:17:24 AM This report has been signed electronically. Number of Addenda: 0 Note Initiated On: 07/28/2018 9:05 AM Scope Withdrawal Time: 0 hours 16 minutes 50 seconds  Total Procedure Duration: 0 hours 24 minutes 39 seconds       [facility]

## 2018-07-28 NOTE — Transfer of Care (Signed)
Immediate Anesthesia Transfer of Care Note  Patient: Maceo ProKaren D Michael  Procedure(s) Performed: COLONOSCOPY WITH PROPOFOL (N/A ) ESOPHAGOGASTRODUODENOSCOPY (EGD) WITH PROPOFOL (N/A )  Patient Location: Endoscopy Unit  Anesthesia Type:General  Level of Consciousness: awake and alert   Airway & Oxygen Therapy: Patient Spontanous Breathing and Patient connected to nasal cannula oxygen  Post-op Assessment: Report given to RN and Post -op Vital signs reviewed and stable  Post vital signs: Reviewed and stable  Last Vitals:  Vitals Value Taken Time  BP    Temp    Pulse    Resp    SpO2      Last Pain:  Vitals:   07/28/18 0903  TempSrc: Tympanic  PainSc: 0-No pain         Complications: No apparent anesthesia complications

## 2018-07-28 NOTE — Anesthesia Preprocedure Evaluation (Signed)
Anesthesia Evaluation  Patient identified by MRN, date of birth, ID band Patient awake    Reviewed: Allergy & Precautions, H&P , NPO status , Patient's Chart, lab work & pertinent test results, reviewed documented beta blocker date and time   History of Anesthesia Complications Negative for: history of anesthetic complications  Airway Mallampati: I  TM Distance: >3 FB Neck ROM: full    Dental  (+) Dental Advidsory Given, Teeth Intact   Pulmonary neg pulmonary ROS,           Cardiovascular Exercise Tolerance: Good negative cardio ROS       Neuro/Psych PSYCHIATRIC DISORDERS Anxiety Depression negative neurological ROS     GI/Hepatic Neg liver ROS, GERD  ,  Endo/Other  Hypothyroidism Hyperthyroidism   Renal/GU negative Renal ROS  negative genitourinary   Musculoskeletal   Abdominal   Peds  Hematology negative hematology ROS (+)   Anesthesia Other Findings Past Medical History: No date: Anxiety No date: Depression No date: Thyroid activity decreased   Reproductive/Obstetrics negative OB ROS                             Anesthesia Physical Anesthesia Plan  ASA: II  Anesthesia Plan: General   Post-op Pain Management:    Induction: Intravenous  PONV Risk Score and Plan: 3 and Propofol infusion and TIVA  Airway Management Planned: Nasal Cannula and Natural Airway  Additional Equipment:   Intra-op Plan:   Post-operative Plan:   Informed Consent: I have reviewed the patients History and Physical, chart, labs and discussed the procedure including the risks, benefits and alternatives for the proposed anesthesia with the patient or authorized representative who has indicated his/her understanding and acceptance.   Dental Advisory Given  Plan Discussed with: Anesthesiologist, CRNA and Surgeon  Anesthesia Plan Comments:         Anesthesia Quick Evaluation

## 2018-07-30 ENCOUNTER — Encounter: Payer: Self-pay | Admitting: Gastroenterology

## 2018-08-02 LAB — SURGICAL PATHOLOGY

## 2018-08-10 ENCOUNTER — Other Ambulatory Visit: Payer: Self-pay | Admitting: Gastroenterology

## 2018-08-11 ENCOUNTER — Other Ambulatory Visit: Payer: Self-pay

## 2018-08-17 ENCOUNTER — Other Ambulatory Visit: Payer: Self-pay | Admitting: Gastroenterology

## 2018-08-17 MED ORDER — METRONIDAZOLE 500 MG PO TABS: 500.0000 mg | ORAL_TABLET | Freq: Two times a day (BID) | ORAL | 0 refills | Status: AC

## 2018-08-17 MED ORDER — LEVOFLOXACIN 500 MG PO TABS: 500.0000 mg | ORAL_TABLET | Freq: Every day | ORAL | 0 refills | Status: AC

## 2018-08-17 MED ORDER — OMEPRAZOLE 20 MG PO CPDR: 20.0000 mg | DELAYED_RELEASE_CAPSULE | Freq: Two times a day (BID) | ORAL | 0 refills | Status: AC

## 2018-08-22 ENCOUNTER — Telehealth: Payer: Self-pay

## 2018-08-22 NOTE — Telephone Encounter (Signed)
LVM for pt to call me back regarding rxs and results.  Thanks [employer]

## 2018-08-22 NOTE — Telephone Encounter (Signed)
-----   Message from Pasty Spillers, MD sent at 08/17/2018  4:42 PM EDT ----- Donna Neal, can you please let patient know that her biopsy showed infection with a bacteria called H. pylori.  I have sent prescriptions over to her pharmacy.  These include, omeprazole, levofloxacin, metronidazole.  For documentation only: The quadruple therapy mentioned in the notes below was not prescribed or sent to the pharmacy due to her allergies.  The only medications that were sent were the ones listed above, omeprazole, levofloxacin and metronidazole.

## 2018-10-09 ENCOUNTER — Ambulatory Visit: Payer: BLUE CROSS/BLUE SHIELD | Admitting: Gastroenterology

## 2018-10-10 ENCOUNTER — Ambulatory Visit: Payer: BLUE CROSS/BLUE SHIELD | Admitting: Family Medicine

## 2018-10-11 ENCOUNTER — Ambulatory Visit: Payer: BLUE CROSS/BLUE SHIELD | Admitting: Family Medicine

## 2018-10-11 ENCOUNTER — Encounter: Payer: Self-pay | Admitting: Family Medicine

## 2018-10-11 VITALS — BP 140/80 | HR 92 | Temp 98.4°F | Resp 16 | Ht 63.0 in | Wt 186.3 lb

## 2018-10-11 DIAGNOSIS — J3089 Other allergic rhinitis: Secondary | ICD-10-CM

## 2018-10-11 DIAGNOSIS — E669 Obesity, unspecified: Secondary | ICD-10-CM

## 2018-10-11 DIAGNOSIS — E0590 Thyrotoxicosis, unspecified without thyrotoxic crisis or storm: Secondary | ICD-10-CM

## 2018-10-11 DIAGNOSIS — M5412 Radiculopathy, cervical region: Secondary | ICD-10-CM

## 2018-10-11 DIAGNOSIS — R1319 Other dysphagia: Secondary | ICD-10-CM

## 2018-10-11 DIAGNOSIS — E559 Vitamin D deficiency, unspecified: Secondary | ICD-10-CM

## 2018-10-11 DIAGNOSIS — F320 Major depressive disorder, single episode, mild: Secondary | ICD-10-CM | POA: Diagnosis not present

## 2018-10-11 DIAGNOSIS — M2550 Pain in unspecified joint: Secondary | ICD-10-CM

## 2018-10-11 DIAGNOSIS — G43719 Chronic migraine without aura, intractable, without status migrainosus: Secondary | ICD-10-CM | POA: Diagnosis not present

## 2018-10-11 DIAGNOSIS — R1310 Dysphagia, unspecified: Secondary | ICD-10-CM

## 2018-10-11 MED ORDER — AZELASTINE-FLUTICASONE 137-50 MCG/ACT NA SUSP: 1.0000 | Freq: Every day | NASAL | 6 refills | Status: DC

## 2018-10-11 MED ORDER — PREDNISONE 5 MG (48) PO TBPK: ORAL_TABLET | ORAL | 0 refills | Status: AC

## 2018-10-11 MED ORDER — MONTELUKAST SODIUM 10 MG PO TABS: 10.0000 mg | ORAL_TABLET | Freq: Every day | ORAL | 3 refills | Status: AC

## 2018-10-11 MED ORDER — ESCITALOPRAM OXALATE 20 MG PO TABS: 20.0000 mg | ORAL_TABLET | Freq: Every day | ORAL | 1 refills | Status: AC

## 2018-10-11 MED ORDER — LEVOCETIRIZINE DIHYDROCHLORIDE 5 MG PO TABS: 5.0000 mg | ORAL_TABLET | Freq: Every evening | ORAL | 1 refills | Status: AC

## 2018-10-11 NOTE — Progress Notes (Signed)
Name: Donna Neal   MRN: [id number]    DOB: 1971-02-25   Date:10/11/2018       Progress Note  Subjective  Chief Complaint  Chief Complaint  Patient presents with  . Follow-up    3 month recheck  . Leg Pain    left leg for 2 weeks pain radiates from thigh to knee cannot sleep at night  . Ankle Pain    right ankle pain 3 weeks hard to stand on foot     HPI  Depression: She notes increased stress lately - her brother is doing much better now - back at work and out of the hospitals. She had 2 aunts that passed recently and this was difficult - was not able to go to one of the funerals.  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  She is taking Lexapro 20mg .  PHQ-9 score of 5 today which is a slight decrease from before.  She has done counseling in the past, does not have time right now. Denies SI/HI, or panic attacks.  Migraines: Stress and not eating are major triggers for her.  She takes extra strength Excedrin without aspirin - and this takes care of the pain.  She has about 1-2 migraines a month. She states migraines are well controlled at this time.  Hyperthyroid: She was referred to endocrinology is March 2019 - she saw Dr. Gershon Crane with endocrinology in June 2019 and they proceeded with medication management - taking methimazole.  Vitamin D Deficiency: Taking supplement daily.  Obesity: Body mass index is 33 kg/m. Weight Management History: She is down 4lbs Diet: Drinking more water, eating lighter meals trying to eat more fruit, grilled chicken and fish, eating more salads.  Does not eat out, cooks at home.  Exercise: was walking 2-3 times a week, however with her recent BLE pain, she has been unable to walk.  RIGHT ankle and LEFT hip pain & Neck pain: Started a couple of weeks ago. Notes her BLE pain started after tripping on a step about a month ago and she has not been able to rest due to her job where she is  constantly on her feet. Neck pain is also new, but unrelated to injury - does endorse occasional numbness and tingling in the LEFT hand/LUE.   Allergies: This has been an issue for many years. She is taking 25-50mg  of benadryl daily. Symptoms include nasal congestion, sometimes sore throat and cough.  We will work on decreasing benadryl usage.  We will add Xyzal, Singulair and Dymista.  GERD and Dysphagia: She is seeing Dr. Maximino Greenland - no difficulty swallowing, taking omeprazole once daily.  Patient Active Problem List   Diagnosis Date Noted  . Dysphagia   . Non-intractable vomiting with nausea   . Special screening for malignant neoplasms, colon   . Diverticulosis of large intestine without diverticulitis   . Hyperthyroidism 04/18/2018  . Abnormal bowel movement 01/25/2018  . Fatigue 01/25/2018  . Current mild episode of major depressive disorder without prior episode (HCC) 01/25/2018  . Vitamin D deficiency disease 11/27/2015  . Intractable chronic migraine without aura and without status migrainosus 08/08/2015  . Obesity, Class II, BMI 35-39.9 07/08/2015    Past Surgical History:  Procedure Laterality Date  . COLONOSCOPY WITH PROPOFOL N/A 07/28/2018   Procedure: COLONOSCOPY WITH PROPOFOL;  Surgeon: Pasty Spillers, MD;  Location: [facility] ENDOSCOPY;  Service: Endoscopy;  Laterality: N/A;  .  ESOPHAGOGASTRODUODENOSCOPY (EGD) WITH PROPOFOL N/A 07/28/2018   Procedure: ESOPHAGOGASTRODUODENOSCOPY (EGD) WITH PROPOFOL;  Surgeon: Pasty Spillers, MD;  Location: [facility] ENDOSCOPY;  Service: Endoscopy;  Laterality: N/A;  . NO PAST SURGERIES    . no past surgery      Family History  Problem Relation Age of Onset  . Hypertension Mother   . Hypertension Brother   . Hypertension Maternal Aunt   . Cancer Maternal Uncle   . Cancer Maternal Grandmother     Social History   Socioeconomic History  . Marital status: Single    Spouse name: Not on file  . Number of children: Not on file  .  Years of education: Not on file  . Highest education level: Not on file  Occupational History  . Not on file  Social Needs  . Financial resource strain: Not on file  . Food insecurity:    Worry: Not on file    Inability: Not on file  . Transportation needs:    Medical: Not on file    Non-medical: Not on file  Tobacco Use  . Smoking status: Never Smoker  . Smokeless tobacco: Never Used  Substance and Sexual Activity  . Alcohol use: No  . Drug use: No  . Sexual activity: Yes    Birth control/protection: Condom  Lifestyle  . Physical activity:    Days per week: Not on file    Minutes per session: Not on file  . Stress: Not on file  Relationships  . Social connections:    Talks on phone: Not on file    Gets together: Not on file    Attends religious service: Not on file    Active member of club or organization: Not on file    Attends meetings of clubs or organizations: Not on file    Relationship status: Not on file  . Intimate partner violence:    Fear of current or ex partner: Not on file    Emotionally abused: Not on file    Physically abused: Not on file    Forced sexual activity: Not on file  Other Topics Concern  . Not on file  Social History Narrative  . Not on file     Current Outpatient Medications:  .  albuterol (PROVENTIL HFA) 108 (90 Base) MCG/ACT inhaler, 2 puffs q.i.d. p.r.n. short of breath, wheezing, or cough, Disp: , Rfl:  .  cholecalciferol (VITAMIN D) 1000 units tablet, Take 1,000 Units by mouth daily., Disp: , Rfl:  .  diphenhydrAMINE (BENADRYL) 25 mg capsule, Take 2 capsules (50 mg total) by mouth every 6 (six) hours as needed., Disp: 60 capsule, Rfl: 0 .  escitalopram (LEXAPRO) 20 MG tablet, Take 1 tablet (20 mg total) by mouth daily., Disp: 90 tablet, Rfl: 0 .  methimazole (TAPAZOLE) 10 MG tablet, Take by mouth., Disp: , Rfl:  .  omeprazole (PRILOSEC) 20 MG capsule, Take 1 capsule (20 mg total) by mouth 2 (two) times daily for 14 days., Disp: 28  capsule, Rfl: 0  Allergies  Allergen Reactions  . Aspirin Hives  . Mucinex [Guaifenesin Er] Hives  . Nsaids Hives  . Penicillins Hives  . Zithromax [Azithromycin] Hives    I personally reviewed active problem list, medication list, allergies, notes from last encounter, lab results with the patient/caregiver today.   ROS Constitutional: Negative for fever or weight change.  Respiratory: Negative for cough and shortness of breath.   Cardiovascular: Negative for chest pain or palpitations.  Gastrointestinal: Negative for  abdominal pain, no bowel changes.  Musculoskeletal: Negative for gait problem or joint swelling.  Skin: Negative for rash.  Neurological: Negative for dizziness or headache.  No other specific complaints in a complete review of systems (except as listed in HPI above).  Objective  Vitals:   10/11/18 0945  BP: 140/80  Pulse: 92  Resp: 16  Temp: 98.4 F (36.9 C)  TempSrc: Oral  SpO2: 99%  Weight: 186 lb 4.8 oz (84.5 kg)  Height: 5\' 3"  (1.6 m)   Body mass index is 33 kg/m.  Physical Exam Constitutional: Patient appears well-developed and well-nourished. No distress.  HENT: Head: Normocephalic and atraumatic. Nose: Nose normal. Mouth/Throat: Oropharynx is clear and moist. No oropharyngeal exudate or tonsillar swelling.  Eyes: Conjunctivae and EOM are normal. No scleral icterus.  Pupils are equal, round, and reactive to light.  Neck: Normal range of motion. Neck supple. No JVD present. No thyromegaly present.  Cardiovascular: Normal rate, regular rhythm and normal heart sounds.  No murmur heard. No BLE edema. Pulmonary/Chest: Effort normal and breath sounds normal. No respiratory distress. Musculoskeletal: Normal range of motion, no joint effusions. No gross deformities Neurological: Pt is alert and oriented to person, place, and time. No cranial nerve deficit. Coordination, balance, strength, speech and gait are normal.  Skin: Skin is warm and dry. No rash  noted. No erythema.  Psychiatric: Patient has a normal mood and affect. behavior is normal. Judgment and thought content normal.   No results found for this or any previous visit (from the past 72 hour(s)).   PHQ2/9: Depression screen [facility] 2/9 10/11/2018 07/04/2018 03/27/2018 03/27/2018 01/25/2018  Decreased Interest 1 0 0 0 0  Down, Depressed, Hopeless 1 0 0 - 0  PHQ - 2 Score 2 0 0 0 0  Altered sleeping 2 2 2  - 3  Tired, decreased energy 1 2 2  - 0  Change in appetite 0 0 0 - 0  Feeling bad or failure about yourself  0 0 0 - 0  Trouble concentrating 0 0 0 - 0  Moving slowly or fidgety/restless 0 2 0 - 0  Suicidal thoughts 0 0 - - 0  PHQ-9 Score 5 6 4  - 3  Difficult doing work/chores Somewhat difficult Somewhat difficult Not difficult at all - Not difficult at all    Fall Risk: Fall Risk  10/11/2018 07/04/2018 01/11/2018  Falls in the past year? 1 No No  Number falls in past yr: 0 - -  Injury with Fall? 0 - -    Assessment & Plan  1. Current mild episode of major depressive disorder without prior episode (HCC) - Stable at this time. - escitalopram (LEXAPRO) 20 MG tablet; Take 1 tablet (20 mg total) by mouth daily.  Dispense: 90 tablet; Refill: 1  2. Intractable chronic migraine without aura and without status migrainosus - Stable, improving  3. Hyperthyroidism - Keep follow up with endocrinology  4. Vitamin D deficiency disease - Continue supplement, will recheck at next visit  5. Obesity, Class II, BMI 35-39.9 - Discussed importance of 150 minutes of physical activity weekly, eat two servings of fish weekly, eat one serving of tree nuts ( cashews, pistachios, pecans, almonds.Marland Kitchen) every other day, eat 6 servings of fruit/vegetables daily and drink plenty of water and avoid sweet beverages.   6. Cervical radicular pain - predniSONE (STERAPRED UNI-PAK 48 TAB) 5 MG (48) TBPK tablet; Take as directed  Dispense: 48 tablet; Refill: 0  7. Arthralgia, unspecified joint - predniSONE  (STERAPRED  UNI-PAK 48 TAB) 5 MG (48) TBPK tablet; Take as directed  Dispense: 48 tablet; Refill: 0  8. Non-seasonal allergic rhinitis, unspecified trigger - Azelastine-Fluticasone 137-50 MCG/ACT SUSP; Place 1 spray into the nose daily.  Dispense: 23 g; Refill: 6 - levocetirizine (XYZAL) 5 MG tablet; Take 1 tablet (5 mg total) by mouth every evening.  Dispense: 90 tablet; Refill: 1 - montelukast (SINGULAIR) 10 MG tablet; Take 1 tablet (10 mg total) by mouth at bedtime.  Dispense: 30 tablet; Refill: 3  9. Esophageal dysphagia - Keep follow up with GI, continue omeprazole.

## 2018-10-13 ENCOUNTER — Telehealth: Payer: Self-pay | Admitting: Family Medicine

## 2018-10-13 MED ORDER — FLUTICASONE PROPIONATE 50 MCG/ACT NA SUSP: 2.0000 | Freq: Every day | NASAL | 3 refills | Status: AC

## 2018-10-13 MED ORDER — AZELASTINE HCL 137 MCG/SPRAY NA SOLN: 1.0000 | Freq: Every day | NASAL | 3 refills | Status: AC

## 2018-10-13 NOTE — Telephone Encounter (Signed)
Copied from CRM [phone]#[id number]. Topic: Quick Communication - Rx Refill/Question >> Oct 13, 2018 11:17 AM Burchel, Abbi R wrote: Medication: Dymista  [employer] Agent called to notify Maurice Smallmily Boyce that PA for this medication has been denied.  Please advise.

## 2018-11-10 ENCOUNTER — Other Ambulatory Visit: Payer: Self-pay | Admitting: Orthopedic Surgery

## 2018-11-10 DIAGNOSIS — M5442 Lumbago with sciatica, left side: Secondary | ICD-10-CM

## 2018-11-22 ENCOUNTER — Ambulatory Visit
Admission: RE | Admit: 2018-11-22 | Discharge: 2018-11-22 | Disposition: A | Discharge status: Home / Self Care | Payer: BLUE CROSS/BLUE SHIELD | Source: Ambulatory Visit | Attending: Orthopedic Surgery | Admitting: Orthopedic Surgery

## 2018-11-22 DIAGNOSIS — M5442 Lumbago with sciatica, left side: Secondary | ICD-10-CM | POA: Insufficient documentation

## 2018-12-27 ENCOUNTER — Other Ambulatory Visit: Payer: Self-pay

## 2018-12-27 ENCOUNTER — Ambulatory Visit: Payer: BLUE CROSS/BLUE SHIELD | Attending: Orthopedic Surgery | Admitting: Physical Therapy

## 2018-12-27 DIAGNOSIS — M79604 Pain in right leg: Secondary | ICD-10-CM | POA: Diagnosis not present

## 2018-12-27 DIAGNOSIS — M545 Low back pain, unspecified: Secondary | ICD-10-CM

## 2018-12-27 DIAGNOSIS — M6281 Muscle weakness (generalized): Secondary | ICD-10-CM | POA: Insufficient documentation

## 2018-12-27 DIAGNOSIS — M79605 Pain in left leg: Secondary | ICD-10-CM | POA: Diagnosis present

## 2018-12-27 DIAGNOSIS — R262 Difficulty in walking, not elsewhere classified: Secondary | ICD-10-CM | POA: Diagnosis present

## 2018-12-27 NOTE — Therapy (Signed)
[facility] [address] [place], [address] Phone: [phone]   Fax:  [phone]  Physical Therapy Evaluation  Patient Details  Name: Donna Neal MRN: [id number] Date of Birth: 04-23-1971 Referring Provider (PT): Marlena Clipper, MD   Encounter Date: 12/27/2018  PT End of Session - 12/27/18 1137    Visit Number  1    Number of Visits  12    Date for PT Re-Evaluation  02/07/19    Authorization Type  [employer] reporting period from 12/27/2018    Authorization Time Period  Current Cert period: 12/27/2018 - 02/07/2019 (last PN: IE 12/27/2018)    Authorization - Visit Number  1    Authorization - Number of Visits  10    PT Start Time  0900    PT Stop Time  1010    PT Time Calculation (min)  70 min    Activity Tolerance  Patient tolerated treatment well    Behavior During Therapy  [facility] for tasks assessed/performed;Agitated   focused on severity of pain      Past Medical History:  Diagnosis Date  . Anxiety   . Depression   . Thyroid activity decreased     Past Surgical History:  Procedure Laterality Date  . COLONOSCOPY WITH PROPOFOL N/A 07/28/2018   Procedure: COLONOSCOPY WITH PROPOFOL;  Surgeon: Pasty Spillers, MD;  Location: [facility] ENDOSCOPY;  Service: Endoscopy;  Laterality: N/A;  . ESOPHAGOGASTRODUODENOSCOPY (EGD) WITH PROPOFOL N/A 07/28/2018   Procedure: ESOPHAGOGASTRODUODENOSCOPY (EGD) WITH PROPOFOL;  Surgeon: Pasty Spillers, MD;  Location: [facility] ENDOSCOPY;  Service: Endoscopy;  Laterality: N/A;  . NO PAST SURGERIES    . no past surgery      There were no vitals filed for this visit.   Subjective Assessment - 12/27/18 0928    How long can you sit comfortably?  < 20 min (if longer leg will be more more sore when she stands after).     How long can you stand comfortably?  < 10 min    How long can you walk comfortably?  < 45-60 min    Diagnostic tests  Imaging: lumbar  MRI report dated 11/22/2018: "IMPRESSION: 1. No significant lumbar spine disc protrusion, foraminal stenosis or central canal stenosis."    Patient Stated Goals  Find out what is causing the pain and make the pain go away and get leg back to normal, get back to walking.     Currently in Pain?  Yes    Pain Score  6    best 2/10, at worst 12/10   Pain Location  Leg    left low back, right upper back, down L leg to the toe. She states the left leg pain is worse when the low back pain is worst, and right leg pain comes independent left left pain.    Pain Orientation  Left   all over left leg just distal to knee   Pain Descriptors / Indicators  Nagging;Throbbing;Tingling;Numbness;Constant   constant, nagging pain, throbbing, intense pain.    Pain Type  Acute pain    Pain Radiating Towards  right upper back, low back, into both legs    Pain Onset  More than a month ago    Pain Frequency  Constant    Aggravating Factors   cannot sleep (usually sleeps on various sides), walking, standing, working, standing up after sitting for a while.     Pain Relieving Factors  heating pad, after sleeping sometimes, when leg is elevated with heating pad, prednisone dose pac (temporary), Vicodin helped but she is out.    Effect of Pain on Daily Activities  Disrupts work, walking, caregiving for neice, walking for exercise, unable to sleep.        SPECIAL SCREENING QUESTIONS Dizziness, double vision, difficulty swallowing, difficulty speaking, fainting spells or blackouts, facial numbness, or nausea: hx of difficulty swallowing.  Recent illness or fever: denies. Recent unexplained weight loss: denies.  Night pain/sweats: night sweats prior to current complaint Recent bowel or bladder function abnormality: denies Current symptoms/concerns not elsewhere described: denies.    [facility] PT Assessment - 12/27/18 0001      Assessment   Medical Diagnosis  neuropathic pain of both legs    Referring Provider (PT)  Marlena Clipper, MD    Onset Date/Surgical Date  11/01/18    Next MD Visit  will see neurologist 01/02/2019     Prior Therapy  none for this problem      Balance Screen   Has the patient fallen in the past 6 months  Yes    How many times?  1    Has the patient had a decrease in activity level because of a fear of falling?   Yes    Is the patient reluctant to leave their home because of a fear of falling?   Yes      Home Nurse, mental health  Private residence    Living Arrangements  Parent;Other relatives    Type of Home  Mobile home    Home Access  Stairs to enter    Entrance Stairs-Number of Steps  4    Entrance Stairs-Rails  Right;Left;Cannot reach both    Home Layout  One level    Home Equipment  [place] - single point      Prior Function   Level of Independence  Independent    Vocation  Full time employment    Vocation Requirements  walking, standing, moving tubs of dishes    Leisure  caring for 50 year old neice      Cognition   Overall Cognitive Status  Within Functional Limits for tasks assessed    Behaviors  Verbal agitation      Observation/Other Assessments   Observations  see note from 12/27/2018 for latest objective data    Focus on Therapeutic Outcomes (FOTO)   42        OBJECTIVE: OBSERVATION/INSPECTION: Patient presents with   NEUROLOGICAL: Dermatomes: LE dermatomes intact and equal to light touch except Left L2, L5, and right S1 felt painful. . Myotomes:  Reflexes:  - Biceps brachii reflex (C5, C6): R = 3+, L = 3+. - Brachioradialis reflex (C6): R = 2+, L = 2+. - Triceps brachii reflex (C7): R = 2+, L = 2+. - Quadriceps reflex (L4): R = 2+, L = 2+. - Achilles reflex (S1): R = 2+, L = 2+. Upper Motor Neuron Screen: Babinski, Hoffman's, and Clonus (ankle) negative bilaterally.  SPINE MOTION Cervical Spine AROM:  WFL at this point without affecting concordant symptoms.   Puffy lesion in right side between upper trap and clavicle.   Lumbar  AROM:  *Indicates pain - Flexion: = 100% pain  in back of legs - Extension: = 75% pain in back of legs - Rotation: B = WFL produced, no worse front of both legs.  - Side Flexion: B = 50% no change in pain.   PERIPHERAL  JOINT MOTION (AROM/PROM in degrees):  *Indicates pain - BLE grossly WNL except painful to move and touch.   STRENGTH:  *Indicates pain Hip  - Flexion: R = 4+/5, L = 4/5* where hand is. - Extension: R = 4+/5, L = 5/5. - Abduction: R = 4+/5, L = 4+/5. - Adduction: Flex: R = 4+/5, L = 4+/5. Knee - Ext: R = 5/5, L = 3+/5* where hand is. - Flex: R = 4/5, L = 3/5* pain where hand is. Ankle (seated position) - Dorsiflexion: R = 5/5, L = 4+/5. - Eversion: R = 4+/5, L = 5/5. - Great toe extension: R = 5/5, L = 4+/5. - Able to toe and heel walk with difficulty due to pain.   REPEATED MOTIONS TESTING: RFIS during = increasing; after = worse, produced back of legs  No loss of ROM flex or ext. Some unsteadiness following but no LOB.  REIS during = increasing, peripheralizing to left foot; after = worse, peripheralized to left calf. No loss of motion. (mild deviation away from left during). Some unsteadiness following but no LOB.  REIL during = peripheralizing; after = peripheralized to back to knee, worse  SPECIAL TESTS: Straight leg raise (SLR): R = negative, L = negative (both sides negative to sensitizing maneuver, vague about symptoms, painful but not specific to nerve pattern).  Thigh thrust: R = negative, L = negatiave. (painful where hands are compressing knees).  FABER: R = negative L = negative. (both sides produced left lateral proximal thigh pain).  SIJ compression and distraction = negative Scacral thrust = negative Lhermitte's sign = negative  ACCESSORY MOTION:  - Hypersensitive to CPA throughout thoracic and lumbar sone, no referral to extremities.   PALPATION: - TTP bilateral glute, L > R, with production of left calf pain with compression of L piriformis  region.    FUNCTIONAL MOBILITY: - Bed mobility: supine <> sit and rolling I without movement quality abnormalities, reports pain at times.  - Transfers: sit <> stand I with some aberant movement at times with reports of pain. - Gait: ambulate household and short community distances [facility] but complains of pain - Stairs: did not test.   Objective measurements completed on examination: See above findings.    TREATMENT:  **Sensitive to Latex** Denies history of spinal surgery Denies history of long term steroid use.   - Exercise purpose/form. Self management techniques. Education on diagnosis, prognosis, POC, anatomy and physiology of current condition Education on HEP including handout     PT Education - 12/27/18 1145    Education Details   Exercise purpose/form. Self management techniques. Education on diagnosis, prognosis, POC, anatomy and physiology of current condition Education on HEP including handout     Person(s) Educated  Patient    Methods  Explanation    Comprehension  Verbalized understanding       PT Short Term Goals - 12/27/18 1146      PT SHORT TERM GOAL #1   Title  Be independent with initial home exercise program for self-management of symptoms.    Baseline  to establish at visit 2    Time  2    Period  Weeks    Status  New    Target Date  01/10/19        PT Long Term Goals - 12/27/18 1147      PT LONG TERM GOAL #1   Title  Be independent with a long-term home exercise program for self-management of symptoms.  Baseline  to be established on visit 2    Time  6    Period  Weeks    Status  New    Target Date  02/07/19      PT LONG TERM GOAL #2   Title  Demonstrate improved FOTO score by 10 units to demonstrate improvement in overall condition and self-reported functional ability.     Baseline  FOTO = 42    Time  6    Period  Weeks    Status  New    Target Date  02/07/19      PT LONG TERM GOAL #3   Title  Patient will demonstrate B knee strength  5/5 to demonstrate functional strength for independent gait, increased standing tolerance, squatting, lifting, carrying and increased ADL ability.    Baseline  see objective exam (12/27/2018);     Time  6    Period  Weeks    Status  New    Target Date  02/07/19      PT LONG TERM GOAL #4   Title  Complete community, work and/or recreational activities without limitation due to current condition.     Baseline  difficulty working, walking, child care, stairs, bending, lifting, child care (12/27/2018);       PT LONG TERM GOAL #5   Title  Reduce pain with functional activities to equal or less than 3/10 to allow patient to complete usual activities including ADLs, IADLs, and social engagement with less difficulty.     Baseline  pain up to 12/10 (12/27/2018);     Time  6    Period  Weeks    Status  New    Target Date  02/07/19             Plan - 12/27/18 1139    Clinical Impression Statement  Patient is a 48 y.o. female referred to outpatient physical therapy with a medical diagnosis of neuropathic pain of both legs who presents with signs and symptoms consistent with non-specific low back pain and bilateral leg pain and weakness. Patient presents with significant pain, paresthesia, weakness, and decreased activity tolerance impairments that are limiting ability to complete sitting, standing, bending, lifting, childcare, work, standing, carrying, walking, and stairs without difficulty. Patient did not show a clear source of symptoms on physical examination and had some difficulty describing symptoms specifically during subjective examination. Patient will benefit from skilled physical therapy intervention to address current body structure impairments and activity limitations to improve function and work towards goals set in current POC in order to return to prior level of function or maximal functional improvement.     History and Personal Factors relevant to plan of care:  monthly migraines,  diverticulosis, thyroid disorder, dysphagia of unknown cause.    Clinical Presentation  Evolving    Clinical Presentation due to:  pain with unclear source at this point, referred to neurologist    Clinical Decision Making  Moderate    Rehab Potential  Fair    Clinical Impairments Affecting Rehab Potential  (+) is seeking care and new employment; (-) high level of focus on pain and disability, 2+ comorbidities, obesity, unknown source of pain at this point    PT Frequency  2x / week    PT Duration  6 weeks    PT Treatment/Interventions  ADLs/Self Care Home Management;Cryotherapy;Moist Heat;Gait training;Stair training;Functional mobility training;Therapeutic activities;Therapeutic exercise;Balance training;Neuromuscular re-education;Patient/family education;Manual techniques;Passive range of motion;Dry needling;Taping;Joint Manipulations;Spinal Manipulations;Other (comment)   joint mobilizations grades I-IV  PT Next Visit Plan  establish HEP    PT Home Exercise Plan  provide at next session    Recommended Other Services  referral to neurologist       Patient will benefit from skilled therapeutic intervention in order to improve the following deficits and impairments:  Decreased balance, Difficulty walking, Decreased mobility, Impaired sensation, Decreased range of motion, Impaired perceived functional ability, Obesity, Decreased activity tolerance, Decreased strength, Pain, Impaired flexibility, Impaired UE functional use  Visit Diagnosis: Pain in both lower extremities  Bilateral low back pain, unspecified chronicity, unspecified whether sciatica present  Difficulty in walking, not elsewhere classified  Muscle weakness (generalized)     Problem List Patient Active Problem List   Diagnosis Date Noted  . Dysphagia   . Non-intractable vomiting with nausea   . Special screening for malignant neoplasms, colon   . Diverticulosis of large intestine without diverticulitis   .  Hyperthyroidism 04/18/2018  . Abnormal bowel movement 01/25/2018  . Fatigue 01/25/2018  . Current mild episode of major depressive disorder without prior episode (HCC) 01/25/2018  . Vitamin D deficiency disease 11/27/2015  . Intractable chronic migraine without aura and without status migrainosus 08/08/2015  . Obesity, Class II, BMI 35-39.9 07/08/2015    Cira RueSara R Briony Parveen, PT, DPT 12/27/2018, 11:55 AM  [facility] [address] [place], [place], [id number] Phone: [phone]   Fax:  [phone]  Name: Maceo ProKaren D Kadow MRN: [id number] Date of Birth: 1971/04/25

## 2019-01-01 ENCOUNTER — Encounter: Payer: Self-pay | Admitting: Physical Therapy

## 2019-01-01 ENCOUNTER — Ambulatory Visit: Payer: BLUE CROSS/BLUE SHIELD | Admitting: Physical Therapy

## 2019-01-01 DIAGNOSIS — M545 Low back pain, unspecified: Secondary | ICD-10-CM

## 2019-01-01 DIAGNOSIS — M79604 Pain in right leg: Secondary | ICD-10-CM | POA: Diagnosis not present

## 2019-01-01 DIAGNOSIS — M79605 Pain in left leg: Principal | ICD-10-CM

## 2019-01-01 DIAGNOSIS — M6281 Muscle weakness (generalized): Secondary | ICD-10-CM

## 2019-01-01 DIAGNOSIS — R262 Difficulty in walking, not elsewhere classified: Secondary | ICD-10-CM

## 2019-01-01 NOTE — Therapy (Signed)
[facility] [facility] PHYSICAL AND SPORTS MEDICINE [address] [place], [place], [address] Phone: [phone]   Fax:  [phone]  Physical Therapy Treatment  Patient Details  Name: Maceo ProKaren D Wentling MRN: [id number] Date of Birth: 1971-01-30 Referring Provider (PT): Marlena ClipperMenz, Michael Joseph, MD   Encounter Date: 01/01/2019  PT End of Session - 01/01/19 1531    Visit Number  2    Number of Visits  12    Date for PT Re-Evaluation  02/07/19    Authorization Type  [employer] reporting period from 12/27/2018    Authorization Time Period  Current Cert period: 12/27/2018 - 02/07/2019 (last PN: IE 12/27/2018)    Authorization - Visit Number  2    Authorization - Number of Visits  10    PT Start Time  1520    PT Stop Time  1600    PT Time Calculation (min)  40 min    Activity Tolerance  Patient tolerated treatment well    Behavior During Therapy  [facility] for tasks assessed/performed;Agitated   focused on severity of pain      Past Medical History:  Diagnosis Date  . Anxiety   . Depression   . Thyroid activity decreased     Past Surgical History:  Procedure Laterality Date  . COLONOSCOPY WITH PROPOFOL N/A 07/28/2018   Procedure: COLONOSCOPY WITH PROPOFOL;  Surgeon: Pasty Spillersahiliani, Varnita B, MD;  Location: [facility] ENDOSCOPY;  Service: Endoscopy;  Laterality: N/A;  . ESOPHAGOGASTRODUODENOSCOPY (EGD) WITH PROPOFOL N/A 07/28/2018   Procedure: ESOPHAGOGASTRODUODENOSCOPY (EGD) WITH PROPOFOL;  Surgeon: Pasty Spillersahiliani, Varnita B, MD;  Location: [facility] ENDOSCOPY;  Service: Endoscopy;  Laterality: N/A;  . NO PAST SURGERIES    . no past surgery      There were no vitals filed for this visit.  Subjective Assessment - 01/01/19 1528    Subjective  Patient reports she has 7/10 pain in her left medial anterior thigh, knee, and lower leg upon arrival. She reports she was sore for about 15 min following last treatment session. She worked this weekend and was on her feet a lot. She hurt  the most on Saturday. She feels no significant difference in function or pain since last treatment session. She wants her leg to get stronger so she can go back to walking. [place]     Pertinent History  Patient is a 48 y.o. female referred to outpatient physical therapy with a medical diagnosis of neuropathic pain of both legs who presents with signs and symptoms consistent with non-specific low back pain and bilateral leg pain and weakness. Patient presents with significant pain, paresthesia, weakness, and decreased activity tolerance impairments that are limiting ability to complete sitting, standing, bending, lifting, childcare, work, standing, carrying, walking, and stairs without difficulty. Patient did not show a clear source of symptoms on physical examination and had some difficulty describing symptoms specifically during subjective examination. Patient will benefit from skilled physical therapy intervention to address current body structure impairments and activity limitations to improve function and work towards goals set in current POC in order to return to prior level of function or maximal functional improvement.     How long can you sit comfortably?  < 20 min (if longer leg will be more more sore when she stands after).     How long can you stand comfortably?  < 10 min    How long can you walk comfortably?  < 45-60 min    Diagnostic tests  Imaging: lumbar MRI report dated 11/22/2018: "IMPRESSION: 1. No significant lumbar spine disc protrusion, foraminal stenosis or central canal stenosis."    Patient Stated Goals  Find out what is causing the pain and make the pain go away and get leg back to normal, get back to walking.     Currently in Pain?  Yes    Pain Score  7     Pain Location  Leg    Pain Orientation  Left    Pain Descriptors / Indicators  Nagging;Throbbing;Tingling;Constant;Numbness   constant, nagging pain, throbbing, intense pain.   Pain Onset  More than a month ago         TREATMENT:  **Sensitive to Latex** Denies history of spinal surgery Denies history of long term steroid use.  - Exercise purpose/form. Self management techniques. Education on diagnosis, prognosis, POC, anatomy and physiology of current condition Education on HEP including handout    TREATMENT:  Therapeutic exercise: to centralize symptoms and improve ROM, strength, muscular endurance, and activity tolerance required for successful completion of functional activities.  - NuStep level 1 using bilateral upper and lower extremities. Seat/handle setting 8. For improved extremity mobility, muscular endurance, and activity tolerance; and to induce the analgesic effect of aerobic exercise, stimulate improved joint nutrition, and prepare body structures and systems for following interventions. x 5  Minutes plus time for setup. - supine repeated flexion (double knees to chest with self OP) 2x10. Pain increasing near hip but no peripheralization, no better, no worse.  - supine repeated flexion (double knees to chest) with clinician OP, peripheralizing to left anterior shin.  - right sideglide at wall x 8 (peripheralizing/increasing left anterior shin, worse).  - flexion-rotation with clinician OP 2x 5 (legs towards right), increasing during, better after both times.  - supine flexion rotation with self-overpressure (knees towards left). 2x1 min. To centralize symptoms. Cuing for form. Pt reports position is comfortable and does not increase symptoms. - Education on HEP including handout   HOME EXERCISE PROGRAM [email or web address] [place] Left Lumbar Rotation Mobilization in Flexion Lie on your back and bring your knees up toward your chest so that your hips are bent at a 90 degree angle. Keeping hips bent, rotate your legs to the left side as far as possible while keeping your shoulders flat. *STOP IF EXERCISE PRODUCES OR INCREASES LEG PAIN OR NUMBNESS Hold 1 Minute Perform 1 Times an Hour     PT Education - 01/01/19 1629    Education Details  Exercise purpose/form. Self management techniques. Education on diagnosis, prognosis, POC, anatomy and physiology of current condition Education on HEP including handout     Person(s) Educated  Patient    Methods  Explanation;Demonstration;Tactile cues;Verbal cues    Comprehension  Verbalized understanding;Returned demonstration       PT Short Term Goals - 12/27/18 1146      PT SHORT TERM GOAL #1   Title  Be independent with initial home exercise program for self-management of symptoms.    Baseline  to establish at visit 2    Time  2    Period  Weeks    Status  New    Target Date  01/10/19        PT Long Term Goals - 12/27/18 1147      PT LONG TERM GOAL #1   Title  Be independent with a long-term home exercise program for self-management of symptoms.     Baseline  to be established on visit 2  Time  6    Period  Weeks    Status  New    Target Date  02/07/19      PT LONG TERM GOAL #2   Title  Demonstrate improved FOTO score by 10 units to demonstrate improvement in overall condition and self-reported functional ability.     Baseline  FOTO = 42    Time  6    Period  Weeks    Status  New    Target Date  02/07/19      PT LONG TERM GOAL #3   Title  Patient will demonstrate B knee strength 5/5 to demonstrate functional strength for independent gait, increased standing tolerance, squatting, lifting, carrying and increased ADL ability.    Baseline  see objective exam (12/27/2018);     Time  6    Period  Weeks    Status  New    Target Date  02/07/19      PT LONG TERM GOAL #4   Title  Complete community, work and/or recreational activities without limitation due to current condition.     Baseline  difficulty working, walking, child care, stairs, bending, lifting, child care (12/27/2018);       PT LONG TERM GOAL #5   Title  Reduce pain with functional activities to equal or less than 3/10 to allow patient to complete usual  activities including ADLs, IADLs, and social engagement with less difficulty.     Baseline  pain up to 12/10 (12/27/2018);     Time  6    Period  Weeks    Status  New    Target Date  02/07/19            Plan - 01/01/19 1627    Clinical Impression Statement  Pt tolerated treatment well. She had peripheralization of symptoms with unloaded repeated flexion and loaded side-gliding closing down left side, that was worse following, so flexion rotation with legs towards left was performed. Pt with increased distatal symptoms during but better following. Pt has some difficulty describing symptoms and although she seemed to report improvement after flexion rotation and reported no increased symptoms during self flexion- rotation she reported similar location and intensity of symptoms following entire session. Pt was provided with initial HEP based on response to today's continued mechanical assessment targeting lumbar spine. Plan to continue with mechanical assessment at next session using force alternatives/progressions as appropriate based on response and moving to hip and knee if no lasting change can be found in the back. Continue to classify provisionally as lumbar derangement vs other using MDT system. Pt was able to complete all exercises with minimal to no lasting increase in pain or discomfort. Pt required multimodal cuing for proper technique and to facilitate improved neuromuscular control, strength, range of motion, and functional ability resulting in improved performance and form. Patient will benefit from skilled physical therapy intervention to address current body structure impairments and activity limitations to improve function and work towards goals set in current POC in order to return to prior level of function or maximal functional improvement.     Rehab Potential  Fair    Clinical Impairments Affecting Rehab Potential  (+) is seeking care and new employment; (-) high level of focus on  pain and disability, 2+ comorbidities, obesity, unknown source of pain at this point    PT Frequency  2x / week    PT Duration  6 weeks    PT Treatment/Interventions  ADLs/Self Care Home Management;Cryotherapy;Moist  Heat;Gait training;Stair training;Functional mobility training;Therapeutic activities;Therapeutic exercise;Balance training;Neuromuscular re-education;Patient/family education;Manual techniques;Passive range of motion;Dry needling;Taping;Joint Manipulations;Spinal Manipulations;Other (comment)   joint mobilizations grades I-IV   PT Next Visit Plan  continue mechanical assessment and treatment.     PT Home Exercise Plan  flexion - rotation       Patient will benefit from skilled therapeutic intervention in order to improve the following deficits and impairments:  Decreased balance, Difficulty walking, Decreased mobility, Impaired sensation, Decreased range of motion, Impaired perceived functional ability, Obesity, Decreased activity tolerance, Decreased strength, Pain, Impaired flexibility, Impaired UE functional use  Visit Diagnosis: Pain in both lower extremities  Bilateral low back pain, unspecified chronicity, unspecified whether sciatica present  Difficulty in walking, not elsewhere classified  Muscle weakness (generalized)     Problem List Patient Active Problem List   Diagnosis Date Noted  . Dysphagia   . Non-intractable vomiting with nausea   . Special screening for malignant neoplasms, colon   . Diverticulosis of large intestine without diverticulitis   . Hyperthyroidism 04/18/2018  . Abnormal bowel movement 01/25/2018  . Fatigue 01/25/2018  . Current mild episode of major depressive disorder without prior episode (HCC) 01/25/2018  . Vitamin D deficiency disease 11/27/2015  . Intractable chronic migraine without aura and without status migrainosus 08/08/2015  . Obesity, Class II, BMI 35-39.9 07/08/2015    Cira Rue, PT, DPT 01/01/2019, 4:30 PM  [facility] PHYSICAL AND SPORTS MEDICINE [address], [place], [address] Phone: [phone]   Fax:  [phone]  Name: VERNEICE REXROAD MRN: [id number] Date of Birth: [birth year]

## 2019-01-03 ENCOUNTER — Ambulatory Visit: Payer: BLUE CROSS/BLUE SHIELD | Admitting: Physical Therapy

## 2019-01-08 ENCOUNTER — Ambulatory Visit: Payer: BLUE CROSS/BLUE SHIELD | Attending: Orthopedic Surgery | Admitting: Physical Therapy

## 2019-01-08 ENCOUNTER — Encounter: Payer: Self-pay | Admitting: Neurology

## 2019-01-08 DIAGNOSIS — M545 Low back pain, unspecified: Secondary | ICD-10-CM

## 2019-01-08 DIAGNOSIS — M79604 Pain in right leg: Secondary | ICD-10-CM | POA: Diagnosis present

## 2019-01-08 DIAGNOSIS — M6281 Muscle weakness (generalized): Secondary | ICD-10-CM | POA: Insufficient documentation

## 2019-01-08 DIAGNOSIS — M79605 Pain in left leg: Secondary | ICD-10-CM | POA: Diagnosis present

## 2019-01-08 DIAGNOSIS — R262 Difficulty in walking, not elsewhere classified: Secondary | ICD-10-CM | POA: Diagnosis present

## 2019-01-08 NOTE — Therapy (Signed)
[facility] PH[facility] AND [facility] MEDICINE [address] [place], [address] Phone: [phone]   Fax:  [phone]  Physical Therapy Treatment  Patient Details  Name: MISHEEL JOKINEN MRN: [id number] Date of Birth: 01-11-1971 Referring Provider (PT): Marlena Clipper, MD   Encounter Date: 01/08/2019  PT End of Session - 01/08/19 1050    Visit Number  3    Number of Visits  12    Date for PT Re-Evaluation  02/07/19    Authorization Type  [employer] reporting period from 12/27/2018    Authorization Time Period  Current Cert period: 12/27/2018 - 02/07/2019 (last PN: IE 12/27/2018)    Authorization - Visit Number  3    Authorization - Number of Visits  10    PT Start Time  1040    PT Stop Time  1120    PT Time Calculation (min)  40 min    Activity Tolerance  Patient tolerated treatment well    Behavior During Therapy  [facility] for tasks assessed/performed;Agitated   focused on severity of pain      Past Medical History:  Diagnosis Date  . Anxiety   . Depression   . Thyroid activity decreased     Past Surgical History:  Procedure Laterality Date  . COLONOSCOPY WITH PROPOFOL N/A 07/28/2018   Procedure: COLONOSCOPY WITH PROPOFOL;  Surgeon: Pasty Spillers, MD;  Location: [facility] ENDOSCOPY;  Service: Endoscopy;  Laterality: N/A;  . ESOPHAGOGASTRODUODENOSCOPY (EGD) WITH PROPOFOL N/A 07/28/2018   Procedure: ESOPHAGOGASTRODUODENOSCOPY (EGD) WITH PROPOFOL;  Surgeon: Pasty Spillers, MD;  Location: [facility] ENDOSCOPY;  Service: Endoscopy;  Laterality: N/A;  . NO PAST SURGERIES    . no past surgery      There were no vitals filed for this visit.  Subjective Assessment - 01/08/19 1044    Subjective  Patient report pain of abtou 5/10 in the left thigh, posterior and lateral and right anterior knee (where she thinks she hit it). She state she didn't work this weekend so she was able to avoid provoking weight bearing more than when she  works. She state she was a little sore following last treatment session, but it was not too bad. She elevated her leg and put ice then heat on it.  she did some of her HEP on friday and saturday, then her right knee started hurting so she stopped. She tried again the next day and the right knee started hurting again so she stopped.  she is unclear in her reporting to determine if the exercise seemed to cause the knee to hurt or not. Unable to determine if pain in right knee is correlated to right knee pain or not.  She has interimittant right knee pain.  She was able to get a neurology appt in 2 months and she is hoping the pain will ease off or not get worse by then.     Pertinent History  Patient is a 48 y.o. female referred to outpatient physical therapy with a medical diagnosis of neuropathic pain of both legs who presents with signs and symptoms consistent with non-specific low back pain and bilateral leg pain and weakness. Patient presents with significant pain, paresthesia, weakness, and decreased activity tolerance impairments that are limiting ability to complete sitting, standing, bending, lifting, childcare, work, standing, carrying, walking, and stairs without difficulty. Patient did not show a clear source of symptoms on physical examination and had some difficulty describing symptoms specifically during subjective examination.  Patient will benefit from skilled physical therapy intervention to address current body structure impairments and activity limitations to improve function and work towards goals set in current POC in order to return to prior level of function or maximal functional improvement.     How long can you sit comfortably?  < 20 min (if longer leg will be more more sore when she stands after).     How long can you stand comfortably?  < 10 min    How long can you walk comfortably?  < 45-60 min    Diagnostic tests  Imaging: lumbar MRI report dated 11/22/2018: "IMPRESSION: 1. No  significant lumbar spine disc protrusion, foraminal stenosis or central canal stenosis."    Patient Stated Goals  Find out what is causing the pain and make the pain go away and get leg back to normal, get back to walking.     Currently in Pain?  Yes    Pain Score  5     Pain Location  Leg    Pain Orientation  Left;Right   left posterior and lateral thigh, right anterior knee   Pain Onset  More than a month ago       TREATMENT: **Sensitive to Latex** Denies history of spinal surgery Denies history of long term steroid use.  -Exercise purpose/form. Self management techniques. Education on diagnosis, prognosis, POC, anatomy and physiology of current condition Education on HEP including handout  TREATMENT:  Therapeutic exercise:to centralize symptoms and improve ROM, strength, muscular endurance, and activity tolerance required for successful completion of functional activities.  - NuStep level 1 using bilateral upper and lower extremities. Seat/handle setting 8. For improved extremity mobility, muscular endurance, and activity tolerance; and to induce the analgesic effect of aerobic exercise, stimulate improved joint nutrition, and prepare body structures and systems for following interventions. x 5  Minutes plus time for setup. - supine flexion rotation with self-overpressure (knees towards left). x1 min. To centralize symptoms. Cuing for form. Pt reports worsening of symptoms in left leg, worse after.  - flexion-rotation with clinician OP 2x 5 (legs towards right), increasing during, worse after.  - hip AROM assessment (most painful in left knee with left hip ER).  - repeated L hip extension in loaded position (knee on edge of table, supported by contralateral leg and hands on chair back). 2x10 (pain centralizing, better down to 3/10 after first set, 3.5/10 after second set).  - prone press up x15 (abolished). 0/10. x15 - repeated extension over edge of table (excellent ability to get to  end range, continues to be abolished, 2x10).  - repeated lumbar extension in standing, difficulty with balance so discontinued. - postural correction in seated position with lumbar roll with instructions for home use, to improve lumbar position and decrease irritation during seated postures. - Education on HEP including handout   HOME EXERCISE PROGRAM Access Code: [email or web address]  URL: [email or web address]  Date: 01/08/2019  Prepared by: Norton Blizzard   Exercises  Seated Correct Posture  Prone Press Up - 10-15 reps - 1 second hold - 10 Hourly  Standing Lumbar Extension with Counter - 10-15 reps - 1 second hold - 10 Hourly      PT Education - 01/08/19 1050    Education Details  Exercise purpose/form. Self management techniques. Education on diagnosis, prognosis, POC, anatomy and physiology of current condition Education on HEP including handout     Person(s) Educated  Patient    Methods  Explanation;Demonstration;Tactile cues    Comprehension  Verbalized  understanding;Returned demonstration       PT Short Term Goals - 12/27/18 1146      PT SHORT TERM GOAL #1   Title  Be independent with initial home exercise program for self-management of symptoms.    Baseline  to establish at visit 2    Time  2    Period  Weeks    Status  New    Target Date  01/10/19        PT Long Term Goals - 12/27/18 1147      PT LONG TERM GOAL #1   Title  Be independent with a long-term home exercise program for self-management of symptoms.     Baseline  to be established on visit 2    Time  6    Period  Weeks    Status  New    Target Date  02/07/19      PT LONG TERM GOAL #2   Title  Demonstrate improved FOTO score by 10 units to demonstrate improvement in overall condition and self-reported functional ability.     Baseline  FOTO = 42    Time  6    Period  Weeks    Status  New    Target Date  02/07/19      PT LONG TERM GOAL #3   Title  Patient will demonstrate B knee strength 5/5  to demonstrate functional strength for independent gait, increased standing tolerance, squatting, lifting, carrying and increased ADL ability.    Baseline  see objective exam (12/27/2018);     Time  6    Period  Weeks    Status  New    Target Date  02/07/19      PT LONG TERM GOAL #4   Title  Complete community, work and/or recreational activities without limitation due to current condition.     Baseline  difficulty working, walking, child care, stairs, bending, lifting, child care (12/27/2018);       PT LONG TERM GOAL #5   Title  Reduce pain with functional activities to equal or less than 3/10 to allow patient to complete usual activities including ADLs, IADLs, and social engagement with less difficulty.     Baseline  pain up to 12/10 (12/27/2018);     Time  6    Period  Weeks    Status  New    Target Date  02/07/19            Plan - 01/08/19 1145    Clinical Impression Statement  Pt tolerated treatment well. She demonstrated worsening of symptoms with flexion rotation motions to each side, so assessment progressed to the left hip. She demonstrated centralization and decreased, better symptoms with repeated hip extension in loaded position. Noted for significant lumbar extension when completing hip extension and this position difficult for her to achieve at home, so attempted prone press ups again (initially peripheralized at initial eval) which further abolished her symptoms. She was then educated in lumbar extension for HEP and posture correction. Her symptoms responded better than any previous treatment today to left hip and lumbar extions.  Pt has some difficulty describing symptoms throughout treatment but seemed quite clear that she felt much better by end of session. Pt was provided with updated HEP based on response to today's continued mechanical assessment targeting left hip and lumbar spine. Plan to continue with mechanical assessment at next session using force  alternatives/progressions as appropriate based on response and continuing to evaluate hip and  knee if no lasting change can be found in the back. Continue to classify provisionally as lumbar derangement vs other using MDT system. Pt was able to complete all exercises with minimal to no lasting increase in pain or discomfort. Pt required multimodal cuing for proper technique and to facilitate improved neuromuscular control, strength, range of motion, and functional ability resulting in improved performance and form. Patient will benefit from skilled physical therapy intervention to address current body structure impairments and activity limitations to improve function and work towards goals set in current POC in order to return to prior level of function or maximal functional improvement.     Rehab Potential  Fair    Clinical Impairments Affecting Rehab Potential  (+) is seeking care and new employment; (-) high level of focus on pain and disability, 2+ comorbidities, obesity, unknown source of pain at this point    PT Frequency  2x / week    PT Duration  6 weeks    PT Treatment/Interventions  ADLs/Self Care Home Management;Cryotherapy;Moist Heat;Gait training;Stair training;Functional mobility training;Therapeutic activities;Therapeutic exercise;Balance training;Neuromuscular re-education;Patient/family education;Manual techniques;Passive range of motion;Dry needling;Taping;Joint Manipulations;Spinal Manipulations;Other (comment)   joint mobilizations grades I-IV   PT Next Visit Plan  continue mechanical assessment and treatment.     PT Home Exercise Plan  Medbridge Acces Code: [email or web address]        Patient will benefit from skilled therapeutic intervention in order to improve the following deficits and impairments:  Decreased balance, Difficulty walking, Decreased mobility, Impaired sensation, Decreased range of motion, Impaired perceived functional ability, Obesity, Decreased activity tolerance, Decreased  strength, Pain, Impaired flexibility, Impaired UE functional use  Visit Diagnosis: Pain in both lower extremities  Bilateral low back pain, unspecified chronicity, unspecified whether sciatica present  Difficulty in walking, not elsewhere classified  Muscle weakness (generalized)     Problem List Patient Active Problem List   Diagnosis Date Noted  . Dysphagia   . Non-intractable vomiting with nausea   . Special screening for malignant neoplasms, colon   . Diverticulosis of large intestine without diverticulitis   . Hyperthyroidism 04/18/2018  . Abnormal bowel movement 01/25/2018  . Fatigue 01/25/2018  . Current mild episode of major depressive disorder without prior episode (HCC) 01/25/2018  . Vitamin D deficiency disease 11/27/2015  . Intractable chronic migraine without aura and without status migrainosus 08/08/2015  . Obesity, Class II, BMI 35-39.9 07/08/2015    Cira RueSara R Palestine Mosco, PT, DPT 01/08/2019, 11:46 AM  [facility] [address] [place], [place], [id number] Phone: [phone]   Fax:  [phone]  Name: Maceo ProKaren D Lefeber MRN: [id number] Date of Birth: [birth year]

## 2019-01-10 ENCOUNTER — Ambulatory Visit: Payer: BLUE CROSS/BLUE SHIELD | Admitting: Physical Therapy

## 2019-01-10 DIAGNOSIS — R262 Difficulty in walking, not elsewhere classified: Secondary | ICD-10-CM

## 2019-01-10 DIAGNOSIS — M6281 Muscle weakness (generalized): Secondary | ICD-10-CM

## 2019-01-10 DIAGNOSIS — M545 Low back pain, unspecified: Secondary | ICD-10-CM

## 2019-01-10 DIAGNOSIS — M79605 Pain in left leg: Principal | ICD-10-CM

## 2019-01-10 DIAGNOSIS — M79604 Pain in right leg: Secondary | ICD-10-CM

## 2019-01-10 NOTE — Therapy (Signed)
[facility] [address] [place], [address] Phone: [phone]   Fax:  [phone]  Physical Therapy Treatment  Patient Details  Name: Donna Neal MRN: [id number] Date of Birth: 08-19-1971 Referring Provider (PT): Marlena Clipper, MD   Encounter Date: 01/10/2019  PT End of Session - 01/10/19 0909    Visit Number  4    Number of Visits  12    Date for PT Re-Evaluation  02/07/19    Authorization Type  [employer] reporting period from 12/27/2018    Authorization Time Period  Current Cert period: 12/27/2018 - 02/07/2019 (last PN: IE 12/27/2018)    Authorization - Visit Number  4    Authorization - Number of Visits  10    PT Start Time  0900    PT Stop Time  0945    PT Time Calculation (min)  45 min    Activity Tolerance  Patient tolerated treatment well    Behavior During Therapy  [facility] for tasks assessed/performed;Agitated   focused on severity of pain      Past Medical History:  Diagnosis Date  . Anxiety   . Depression   . Thyroid activity decreased     Past Surgical History:  Procedure Laterality Date  . COLONOSCOPY WITH PROPOFOL N/A 07/28/2018   Procedure: COLONOSCOPY WITH PROPOFOL;  Surgeon: Pasty Spillers, MD;  Location: [facility] ENDOSCOPY;  Service: Endoscopy;  Laterality: N/A;  . ESOPHAGOGASTRODUODENOSCOPY (EGD) WITH PROPOFOL N/A 07/28/2018   Procedure: ESOPHAGOGASTRODUODENOSCOPY (EGD) WITH PROPOFOL;  Surgeon: Pasty Spillers, MD;  Location: [facility] ENDOSCOPY;  Service: Endoscopy;  Laterality: N/A;  . NO PAST SURGERIES    . no past surgery      There were no vitals filed for this visit.  Subjective Assessment - 01/10/19 0900    Subjective  Patient reported when she left last treatment session she was feeling better. She went home and rested for a while sitting with leg propped up on a pillow (because it was already hurting). It started hurting about 45 min after she got home.   When she got up she started doing some of the exercises and then stopped because it started hurting more.  She tried to do the exercises each hour. She felt it was hurting her more in the left knee and the back of her leg, so she stopped.  She had some pain in the lower back as well.  She had diffiiculty sleeping last night due to bakc pain that she put heat on and ice on her knee. She felt some constant pain in the right anterior upper thigh.  She states she has a bruise on her right leg. She feels a bit of softness that feels like a bruise on the left side.  She reports pain of 7/10 in bilateral anterior proximal thighs upon arrival. She also has a little pain on the left lumbar region and left anterior knee.  She also has pain in the right gastrocs region.     Pertinent History  Patient is a 48 y.o. female referred to outpatient physical therapy with a medical diagnosis of neuropathic pain of both legs who presents with signs and symptoms consistent with non-specific low back pain and bilateral leg pain and weakness. Patient presents with significant pain, paresthesia, weakness, and decreased activity tolerance impairments that are limiting ability to complete sitting, standing, bending, lifting, childcare, work, standing, carrying, walking, and stairs without difficulty. Patient did  not show a clear source of symptoms on physical examination and had some difficulty describing symptoms specifically during subjective examination. Patient will benefit from skilled physical therapy intervention to address current body structure impairments and activity limitations to improve function and work towards goals set in current POC in order to return to prior level of function or maximal functional improvement.     How long can you sit comfortably?  < 20 min (if longer leg will be more more sore when she stands after).     How long can you stand comfortably?  < 10 min    How long can you walk comfortably?  < 45-60 min     Diagnostic tests  Imaging: lumbar MRI report dated 11/22/2018: "IMPRESSION: 1. No significant lumbar spine disc protrusion, foraminal stenosis or central canal stenosis."    Patient Stated Goals  Find out what is causing the pain and make the pain go away and get leg back to normal, get back to walking.     Currently in Pain?  Yes    Pain Score  7     Pain Location  Leg    Pain Orientation  Right;Left    Pain Onset  More than a month ago         TREATMENT: **Sensitive to Latex** Denies history of spinal surgery Denies history of long term steroid use.  -Exercise purpose/form. Self management techniques. Education on diagnosis, prognosis, POC, anatomy and physiology of current condition Education on HEP including handout  TREATMENT: Therapeutic exercise:to centralize symptoms and improve ROM, strength, muscular endurance, and activity tolerance required for successful completion of functional activities. - repeated extension over edge of table (excellent ability to get to end range, centralizing from left knee to anterior thigh after first set, after second set back to left knee but no right distal leg pain, 2x10).  - prone press up x10 (increasing, peripheralizing, unclear).  - repeated L hip extension in loaded position (knee on edge of table, supported by contralateral leg and hands on chair back). 2x10 (pain centralizing, better down to 4/10 in left knee and right no worse after first set, 2/10 after second set, 0/10 after 3rd set).  - repeated left hip extension with knee on pillows on floor and chair support at both UE each side, to mimic how this exercise can be completed at not===.   -NuStep level1using bilateral upper and lower extremities. Seat/handle setting 8. For improved extremity mobility, muscular endurance, and activity tolerance; and to induce the analgesic effect of aerobic exercise, stimulate improved joint nutrition, and prepare body structures and systems for  following interventions. x5Minutes plus time for setup.    -Education on HEP including handout  Manual therapy: to reduce pain and tissue tension, improve range of motion, neuromodulation, in order to promote improved ability to complete functional activities. - prone CPA along lumbar and thoracic spine. Reproduced some leg pain per pt report with CPA along much of thoracic spine and parts of lumbar spine intermixed with localized production of pain at adjacent levels. No strong level pattern. - prone press up with clinician overpressure at upper lumbar region. Peripheralizing so discontinued.   HOME EXERCISE PROGRAM Access Code: [email or web address]  URL: [email or web address]  Date: 01/08/2019  Prepared by: Norton Blizzard   Exercises   Seated Correct Posture   HEP2Go: HALF-KNEELING HIP EXTENSION, left hip    PT Education - 01/10/19 0909    Education Details  Exercise purpose/form. Self management techniques. Education on diagnosis, prognosis, POC,  anatomy and physiology of current condition    Person(s) Educated  Patient    Methods  Explanation;Demonstration;Tactile cues;Verbal cues    Comprehension  Verbalized understanding;Returned demonstration       PT Short Term Goals - 01/10/19 0948      PT SHORT TERM GOAL #1   Title  Be independent with initial home exercise program for self-management of symptoms.    Baseline  does not complete as prescribed, partially compliant (01/10/2019);     Time  2    Period  Weeks    Status  On-going    Target Date  01/24/19        PT Long Term Goals - 12/27/18 1147      PT LONG TERM GOAL #1   Title  Be independent with a long-term home exercise program for self-management of symptoms.     Baseline  to be established on visit 2    Time  6    Period  Weeks    Status  New    Target Date  02/07/19      PT LONG TERM GOAL #2   Title  Demonstrate improved FOTO score by 10 units to demonstrate improvement in overall condition and  self-reported functional ability.     Baseline  FOTO = 42    Time  6    Period  Weeks    Status  New    Target Date  02/07/19      PT LONG TERM GOAL #3   Title  Patient will demonstrate B knee strength 5/5 to demonstrate functional strength for independent gait, increased standing tolerance, squatting, lifting, carrying and increased ADL ability.    Baseline  see objective exam (12/27/2018);     Time  6    Period  Weeks    Status  New    Target Date  02/07/19      PT LONG TERM GOAL #4   Title  Complete community, work and/or recreational activities without limitation due to current condition.     Baseline  difficulty working, walking, child care, stairs, bending, lifting, child care (12/27/2018);       PT LONG TERM GOAL #5   Title  Reduce pain with functional activities to equal or less than 3/10 to allow patient to complete usual activities including ADLs, IADLs, and social engagement with less difficulty.     Baseline  pain up to 12/10 (12/27/2018);     Time  6    Period  Weeks    Status  New    Target Date  02/07/19            Plan - 01/10/19 0948    Clinical Impression Statement  Pt tolerated treatment well. She demonstrated worsening of symptoms with lumbar extension progression but again had abolishment of left knee pain with repeated left hip extension. She reported her knee pain was 1/10 by end of session. Updated HEP to include repeated hip extension and practiced how to safely perform at home. Pt is highly focused on her pain and is partially compliant with HEP. Her pain is variable and she has difficulty describing symptoms consistently. She continues to report tingling in feet that come and goes and worsens with standing.  She again demonstrated centralization and decreased, better symptoms with repeated hip extension in loaded position.  Pt has some difficulty describing symptoms throughout treatment but seemed moderately clear that she felt much better by end of session.  Expect to reclassify her  pain as MDT other at next session if no lasting response to intervention and will work more on generalized strengthening and activity tolerance with less focus on finding and using a directional preference pending response.Pt was provided with updated HEP based on response to today's continued mechanical assessment targeting left hip.  Continue to classify provisionally as lumbar derangement vs other using MDT system. Pt was able to complete all exercises with minimal to no lasting increase in pain or discomfort. Pt required multimodal cuing for proper technique and to facilitate improved neuromuscular control, strength, range of motion, and functional ability resulting in improved performance and form. Patient will benefit from skilled physical therapy intervention to address current body structure impairments and activity limitations to improve function and work towards goals set in current POC in order to return to prior level of function or maximal functional improvement.     Rehab Potential  Fair    Clinical Impairments Affecting Rehab Potential  (+) is seeking care and new employment; (-) high level of focus on pain and disability, 2+ comorbidities, obesity, unknown source of pain at this point    PT Frequency  2x / week    PT Duration  6 weeks    PT Treatment/Interventions  ADLs/Self Care Home Management;Cryotherapy;Moist Heat;Gait training;Stair training;Functional mobility training;Therapeutic activities;Therapeutic exercise;Balance training;Neuromuscular re-education;Patient/family education;Manual techniques;Passive range of motion;Dry needling;Taping;Joint Manipulations;Spinal Manipulations;Other (comment)   joint mobilizations grades I-IV   PT Next Visit Plan  continue mechanical assessment and treatment. progress to MDT classification of other at next session if no lasting response to HEP    PT Home Exercise Plan  Medbridge Acces Code: [email or web address]        Patient will  benefit from skilled therapeutic intervention in order to improve the following deficits and impairments:  Decreased balance, Difficulty walking, Decreased mobility, Impaired sensation, Decreased range of motion, Impaired perceived functional ability, Obesity, Decreased activity tolerance, Decreased strength, Pain, Impaired flexibility, Impaired UE functional use  Visit Diagnosis: Pain in both lower extremities  Bilateral low back pain, unspecified chronicity, unspecified whether sciatica present  Difficulty in walking, not elsewhere classified  Muscle weakness (generalized)     Problem List Patient Active Problem List   Diagnosis Date Noted  . Dysphagia   . Non-intractable vomiting with nausea   . Special screening for malignant neoplasms, colon   . Diverticulosis of large intestine without diverticulitis   . Hyperthyroidism 04/18/2018  . Abnormal bowel movement 01/25/2018  . Fatigue 01/25/2018  . Current mild episode of major depressive disorder without prior episode (HCC) 01/25/2018  . Vitamin D deficiency disease 11/27/2015  . Intractable chronic migraine without aura and without status migrainosus 08/08/2015  . Obesity, Class II, BMI 35-39.9 07/08/2015    Cira Rue, PT, DPT 01/10/2019, 9:49 AM  [facility] PH[facility] AND SPORTS MEDICINE [address] [place], [address] Phone: [phone]   Fax:  [phone]  Name: BRITTANNI CARIKER MRN: [id number] Date of Birth: 13-Jun-1971

## 2019-01-15 ENCOUNTER — Encounter: Payer: BLUE CROSS/BLUE SHIELD | Admitting: Family Medicine

## 2019-01-17 ENCOUNTER — Encounter: Payer: Self-pay | Admitting: Physical Therapy

## 2019-01-17 ENCOUNTER — Other Ambulatory Visit: Payer: Self-pay

## 2019-01-17 ENCOUNTER — Ambulatory Visit: Payer: BLUE CROSS/BLUE SHIELD | Admitting: Physical Therapy

## 2019-01-17 DIAGNOSIS — M6281 Muscle weakness (generalized): Secondary | ICD-10-CM

## 2019-01-17 DIAGNOSIS — R262 Difficulty in walking, not elsewhere classified: Secondary | ICD-10-CM

## 2019-01-17 DIAGNOSIS — M79604 Pain in right leg: Secondary | ICD-10-CM | POA: Diagnosis not present

## 2019-01-17 DIAGNOSIS — M79605 Pain in left leg: Principal | ICD-10-CM

## 2019-01-17 DIAGNOSIS — M545 Low back pain, unspecified: Secondary | ICD-10-CM

## 2019-01-17 NOTE — Therapy (Signed)
[facility] [address], [place], [address] Phone: [phone]   Fax:  [phone]  Physical Therapy Treatment  Patient Details  Name: Donna Neal MRN: [id number] Date of Birth: 25-Jul-1971 Referring Provider (PT): Marlena Clipper, MD   Encounter Date: 01/17/2019  PT End of Session - 01/17/19 0913    Visit Number  5    Number of Visits  12    Date for PT Re-Evaluation  02/07/19    Authorization Type  [employer] reporting period from 12/27/2018    Authorization Time Period  Current Cert period: 12/27/2018 - 02/07/2019 (last PN: IE 12/27/2018)    Authorization - Visit Number  5    Authorization - Number of Visits  10    PT Start Time  0905    PT Stop Time  0945    PT Time Calculation (min)  40 min    Activity Tolerance  Patient tolerated treatment well    Behavior During Therapy  [facility] for tasks assessed/performed   focused on severity of pain      Past Medical History:  Diagnosis Date  . Anxiety   . Depression   . Thyroid activity decreased     Past Surgical History:  Procedure Laterality Date  . COLONOSCOPY WITH PROPOFOL N/A 07/28/2018   Procedure: COLONOSCOPY WITH PROPOFOL;  Surgeon: Pasty Spillers, MD;  Location: [facility] ENDOSCOPY;  Service: Endoscopy;  Laterality: N/A;  . ESOPHAGOGASTRODUODENOSCOPY (EGD) WITH PROPOFOL N/A 07/28/2018   Procedure: ESOPHAGOGASTRODUODENOSCOPY (EGD) WITH PROPOFOL;  Surgeon: Pasty Spillers, MD;  Location: [facility] ENDOSCOPY;  Service: Endoscopy;  Laterality: N/A;  . NO PAST SURGERIES    . no past surgery      There were no vitals filed for this visit.  Subjective Assessment - 01/17/19 0908    Subjective  Patient reports she feels her legs are slightly better. Her doctor prescribed her a pain medication that she takes at night and she is sleeping much better. She continues to have the worst pain at work and states that sititng and elevating her leg  helps the most. she tried her specific hip extension exercise one time after work when it was very painful but it made her knee feel worse. She does seem to think the exercises are helping some.  She continues to report she cannot sit when she wants to at work and that she feels she needs a different job.  She reports decrease in tingling sensation that she had been getting intermittantly.      Pertinent History  Patient is a 48 y.o. female referred to outpatient physical therapy with a medical diagnosis of neuropathic pain of both legs who presents with signs and symptoms consistent with non-specific low back pain and bilateral leg pain and weakness. Patient presents with significant pain, paresthesia, weakness, and decreased activity tolerance impairments that are limiting ability to complete sitting, standing, bending, lifting, childcare, work, standing, carrying, walking, and stairs without difficulty. Patient did not show a clear source of symptoms on physical examination and had some difficulty describing symptoms specifically during subjective examination. Patient will benefit from skilled physical therapy intervention to address current body structure impairments and activity limitations to improve function and work towards goals set in current POC in order to return to prior level of function or maximal functional improvement.     How long can you sit comfortably?  < 20 min (if longer leg will be more more  sore when she stands after).     How long can you stand comfortably?  < 10 min    How long can you walk comfortably?  < 45-60 min    Diagnostic tests  Imaging: lumbar MRI report dated 11/22/2018: "IMPRESSION: 1. No significant lumbar spine disc protrusion, foraminal stenosis or central canal stenosis."    Patient Stated Goals  Find out what is causing the pain and make the pain go away and get leg back to normal, get back to walking.     Currently in Pain?  No/denies    Pain Onset  More than a  month ago         TREATMENT: **Sensitive to Latex** Denies history of spinal surgery Denies history of long term steroid use.  -Exercise purpose/form. Self management techniques. Education on diagnosis, prognosis, POC, anatomy and physiology of current condition Education on HEP including handout  TREATMENT: Therapeutic exercise:to centralize symptoms and improve ROM, strength, muscular endurance, and activity tolerance required for successful completion of functional activities. -NuStep level2using bilateral upper and lower extremities. Seat/handle setting 7/8. For improved extremity mobility, muscular endurance, and activity tolerance; and to induce the analgesic effect of aerobic exercise, stimulate improved joint nutrition, and prepare body structures and systems for following interventions. x8during subjective exam.  - repeated left hip extension with knee on airex on floor and chair support at left UE, to mimic how this exercise can be completed at home. X12, x10, x10, x10 with good form reaching end range. Throughout session including start and end to decrease any pain that starts to appear with other exercises.  - standing hip abduction at hip machine (facing out to encourage neutral to extended hip position). To improve lateral hip strength for single leg stance activities, such as walking.  cuing to keep toes forward to prevent excessive hip ER compensation, and cuing to put feet in middle of landing pad to improve tension throughout full ROM. 2x10 each side at 25#. Pt reported fairly easy. Mild increase in pain near left knee by end of second set  - standing hip extension (ROM from ~90 degrees flexion to max extension), to improve hip ROM and strength for functional activities. Cuing to stand up tall and extend BUE while holding handle to prevent excessive forward bend. 2x10 at 55#.  - Heel raises while standing on 20 degree slant board with BUE support and cuing to keep hips  forward. To improve lower leg strength and endurance for weight bearing activities. X  15 (muscle fatigue induced). Well tolerated. - Total gym squats at highest level. To improve knee and hip extension strength and tolerance for functional activities. Cuing for technique. 2x10. Well tolerated.    HOME EXERCISE PROGRAM Access Code: [email or web address]  URL: [email or web address]  Date: 01/08/2019  Prepared by: Norton Blizzard   Exercises   Seated Correct Posture   HEP2Go: HALF-KNEELING HIP EXTENSION, left hip    PT Education - 01/17/19 0913    Education Details  Exercise purpose/form. Self management techniques. Education on diagnosis, prognosis, POC, anatomy and physiology of current condition    Person(s) Educated  Patient    Methods  Explanation;Demonstration;Tactile cues;Verbal cues    Comprehension  Verbalized understanding;Returned demonstration       PT Short Term Goals - 01/17/19 4782      PT SHORT TERM GOAL #1   Title  Be independent with initial home exercise program for self-management of symptoms.    Baseline  does not complete as prescribed, partially compliant (  01/10/2019);     Time  2    Period  Weeks    Status  Achieved    Target Date  01/24/19        PT Long Term Goals - 12/27/18 1147      PT LONG TERM GOAL #1   Title  Be independent with a long-term home exercise program for self-management of symptoms.     Baseline  to be established on visit 2    Time  6    Period  Weeks    Status  New    Target Date  02/07/19      PT LONG TERM GOAL #2   Title  Demonstrate improved FOTO score by 10 units to demonstrate improvement in overall condition and self-reported functional ability.     Baseline  FOTO = 42    Time  6    Period  Weeks    Status  New    Target Date  02/07/19      PT LONG TERM GOAL #3   Title  Patient will demonstrate B knee strength 5/5 to demonstrate functional strength for independent gait, increased standing tolerance, squatting,  lifting, carrying and increased ADL ability.    Baseline  see objective exam (12/27/2018);     Time  6    Period  Weeks    Status  New    Target Date  02/07/19      PT LONG TERM GOAL #4   Title  Complete community, work and/or recreational activities without limitation due to current condition.     Baseline  difficulty working, walking, child care, stairs, bending, lifting, child care (12/27/2018);       PT LONG TERM GOAL #5   Title  Reduce pain with functional activities to equal or less than 3/10 to allow patient to complete usual activities including ADLs, IADLs, and social engagement with less difficulty.     Baseline  pain up to 12/10 (12/27/2018);     Time  6    Period  Weeks    Status  New    Target Date  02/07/19            Plan - 01/17/19 0944    Clinical Impression Statement  Pt tolerated treatment well and is making good progress towards goals at this point. This is the first session she has presented reporting no pain upon arrival. She feels her HEP is helping and she reports she is benefiting from taking tramadol at night. She is moving better today and was able to get down to the floor to stretch left hip into extension very easily compared to previously. She continues to respond well to repeated loaded left hip extension, with abolishment of all left knee pain following this procedure. She had mild reproduction of symptoms with other exercises, but this was resolved with repeated hip extension. Patient has improved participation with HEP with improved results. Slightly less focused on pain today. She does have some difficulty describing symptoms, but it seems clear that they are better today. Pt was able to complete all exercises with minimal to no lasting increase in pain or discomfort. Pt required multimodal cuing for proper technique and to facilitate improved neuromuscular control, strength, range of motion, and functional ability resulting in improved performance and form.  Patient will benefit from skilled physical therapy intervention to address current body structure impairments and activity limitations to improve function and work towards goals set in current POC in order to  return to prior level of function or maximal functional improvement.    Rehab Potential  Fair    Clinical Impairments Affecting Rehab Potential  (+) is seeking care and new employment; (-) high level of focus on pain and disability, 2+ comorbidities, obesity, unknown source of pain at this point    PT Frequency  2x / week    PT Duration  6 weeks    PT Treatment/Interventions  ADLs/Self Care Home Management;Cryotherapy;Moist Heat;Gait training;Stair training;Functional mobility training;Therapeutic activities;Therapeutic exercise;Balance training;Neuromuscular re-education;Patient/family education;Manual techniques;Passive range of motion;Dry needling;Taping;Joint Manipulations;Spinal Manipulations;Other (comment)   joint mobilizations grades I-IV   PT Next Visit Plan  continues to respond to repeated left hip extension. Plan to progress to general LE and functional strengthening with repeated hip extension interspersed as needed to control symptoms.      PT Home Exercise Plan  Medbridge Acces Code: [email or web address]     Consulted and Agree with Plan of Care  Patient       Patient will benefit from skilled therapeutic intervention in order to improve the following deficits and impairments:  Decreased balance, Difficulty walking, Decreased mobility, Impaired sensation, Decreased range of motion, Impaired perceived functional ability, Obesity, Decreased activity tolerance, Decreased strength, Pain, Impaired flexibility, Impaired UE functional use  Visit Diagnosis: Pain in both lower extremities  Bilateral low back pain, unspecified chronicity, unspecified whether sciatica present  Difficulty in walking, not elsewhere classified  Muscle weakness (generalized)     Problem List Patient Active  Problem List   Diagnosis Date Noted  . Dysphagia   . Non-intractable vomiting with nausea   . Special screening for malignant neoplasms, colon   . Diverticulosis of large intestine without diverticulitis   . Hyperthyroidism 04/18/2018  . Abnormal bowel movement 01/25/2018  . Fatigue 01/25/2018  . Current mild episode of major depressive disorder without prior episode (HCC) 01/25/2018  . Vitamin D deficiency disease 11/27/2015  . Intractable chronic migraine without aura and without status migrainosus 08/08/2015  . Obesity, Class II, BMI 35-39.9 07/08/2015    Cira Rue, PT, DPT 01/17/2019, 9:46 AM  [facility] PH[facility] SPORTS MEDICINE [address] [place], [address] Phone: [phone]   Fax:  [phone]  Name: FATIMATOU KEEFFE MRN: [id number] Date of Birth: 1971/05/03

## 2019-01-18 ENCOUNTER — Other Ambulatory Visit: Payer: Self-pay

## 2019-01-22 ENCOUNTER — Encounter: Payer: Self-pay | Admitting: Physical Therapy

## 2019-01-22 ENCOUNTER — Other Ambulatory Visit: Payer: Self-pay

## 2019-01-22 ENCOUNTER — Ambulatory Visit: Payer: BLUE CROSS/BLUE SHIELD | Admitting: Physical Therapy

## 2019-01-22 DIAGNOSIS — M545 Low back pain, unspecified: Secondary | ICD-10-CM

## 2019-01-22 DIAGNOSIS — R262 Difficulty in walking, not elsewhere classified: Secondary | ICD-10-CM

## 2019-01-22 DIAGNOSIS — M79604 Pain in right leg: Secondary | ICD-10-CM

## 2019-01-22 DIAGNOSIS — M79605 Pain in left leg: Principal | ICD-10-CM

## 2019-01-22 DIAGNOSIS — M6281 Muscle weakness (generalized): Secondary | ICD-10-CM

## 2019-01-22 NOTE — Therapy (Signed)
[place] [facility] PHYSICAL AND SPORTS MEDICINE [address] [place], [place], [address] Phone: [phone]   Fax:  [phone]  Physical Therapy Treatment  Patient Details  Name: Donna Neal MRN: [id number] Date of Birth: Dec 13, 1970 Referring Provider (PT): Marlena ClipperMenz, Michael Joseph, MD   Encounter Date: 01/22/2019  PT End of Session - 01/22/19 1050    Visit Number  6    Number of Visits  12    Date for PT Re-Evaluation  02/07/19    Authorization Type  [employer] reporting period from 12/27/2018    Authorization Time Period  Current Cert period: 12/27/2018 - 02/07/2019 (last PN: IE 12/27/2018)    Authorization - Visit Number  6    Authorization - Number of Visits  10    PT Start Time  1030    PT Stop Time  1115    PT Time Calculation (min)  45 min    Activity Tolerance  Patient tolerated treatment well    Behavior During Therapy  [facility] tasks assessed/performed   focused on severity of pain      Past Medical History:  Diagnosis Date  . Anxiety   . Depression   . Thyroid activity decreased     Past Surgical History:  Procedure Laterality Date  . COLONOSCOPY WITH PROPOFOL N/A 07/28/2018   Procedure: COLONOSCOPY WITH PROPOFOL;  Surgeon: Pasty Spillersahiliani, Varnita B, MD;  Location: [facility] ENDOSCOPY;  Service: Endoscopy;  Laterality: N/A;  . ESOPHAGOGASTRODUODENOSCOPY (EGD) WITH PROPOFOL N/A 07/28/2018   Procedure: ESOPHAGOGASTRODUODENOSCOPY (EGD) WITH PROPOFOL;  Surgeon: Pasty Spillersahiliani, Varnita B, MD;  Location: [facility] ENDOSCOPY;  Service: Endoscopy;  Laterality: N/A;  . NO PAST SURGERIES    . no past surgery      There were no vitals filed for this visit.  Subjective Assessment - 01/22/19 1034    Subjective  Patient reports she has been at work since 6:30 this morning (appt was at 10:30) and her left knee and thigh is hurting. She reats it 9/10 upon arrival and is limping slighlty but is able to get in all positions requested. States her right side has  been doing better overall. She feels she can walk more without having the pain in her leg. She did not walk this weekend.  She is doing her HEP about 3-4 times a day. She states she does sometimes use the hip stretch to stop her pain at home.  Pt reports she was "a little bit sore" after last treatment esssion but it was okay and she put some ice and heat on it.     Pertinent History  Patient is a 48 y.o. female referred to outpatient physical therapy with a medical diagnosis of neuropathic pain of both legs who presents with signs and symptoms consistent with non-specific low back pain and bilateral leg pain and weakness. Patient presents with significant pain, paresthesia, weakness, and decreased activity tolerance impairments that are limiting ability to complete sitting, standing, bending, lifting, childcare, work, standing, carrying, walking, and stairs without difficulty. Patient did not show a clear source of symptoms on physical examination and had some difficulty describing symptoms specifically during subjective examination. Patient will benefit from skilled physical therapy intervention to address current body structure impairments and activity limitations to improve function and work towards goals set in current POC in order to return to prior level of function or maximal functional improvement.     How long can you sit comfortably?  < 20 min (if longer  leg will be more more sore when she stands after).     How long can you stand comfortably?  < 10 min    How long can you walk comfortably?  < 45-60 min    Diagnostic tests  Imaging: lumbar MRI report dated 11/22/2018: "IMPRESSION: 1. No significant lumbar spine disc protrusion, foraminal stenosis or central canal stenosis."    Patient Stated Goals  Find out what is causing the pain and make the pain go away and get leg back to normal, get back to walking.     Currently in Pain?  Yes    Pain Score  9     Pain Location  Leg    Pain Orientation   Left    Pain Onset  More than a month ago       TREATMENT: **Sensitive to Latex** Denies history of spinal surgery Denies history of long term steroid use.  -Exercise purpose/form. Self management techniques. Education on diagnosis, prognosis, POC, anatomy and physiology of current condition Education on HEP including handout  TREATMENT: Therapeutic exercise:to centralize symptoms and improve ROM, strength, muscular endurance, and activity tolerance required for successful completion of functional activities. - repeated left hip extension with knee on airex on floor and chair support at left UE, to mimic how this exercise can be completed at home. X10, x12, x10, x10 with good form reaching end range. Throughout session including start and end to decrease any pain that starts to appear with other exercises. -NuStep level6using bilateral upper and lower extremities. Seat/handle setting 7/8. For improved extremity mobility, muscular endurance, and activity tolerance; and to induce the analgesic effect of aerobic exercise, stimulate improved joint nutrition, and prepare body structures and systems for following interventions. x6during subjective exam.  - standing hip abduction at hip machine (facing out to encourage neutral to extended hip position). To improve lateral hip strength for single leg stance activities, such as walking.  cuing to keep toes forward to prevent excessive hip ER compensation, and cuing to put feet in middle of landing pad to improve tension throughout full ROM. x20 each side at 40#. Pt reported fairly easy. Mild increase in pain near left knee by end of second set  - standing hip extension (ROM from ~90 degrees flexion to max extension), to improve hip ROM and strength for functional activities. Cuing to stand up tall and extend BUE while holding handle to prevent excessive forward bend. x20 at #70.  - Heel raises while standing on 20 degree slant board with BUE  support and cuing to keep hips forward. To improve lower leg strength and endurance for weight bearing activities. 3X 15 (muscle fatigue induced). Well tolerated. - Total gym squats at highest level. To improve knee and hip extension strength and tolerance for functional activities. Cuing for technique. 3x15. Well tolerated.  - Prone hip extension with knee flexed to preferentially bias gluteus maximus. x30 on each side. Cuing to keep knee flexed. Pt required time to learn and for transitions between sides.   - Step ups to 6" step, 2x10 each side with UUE support concentrating on improving functional strength, activity tolerance, and control.   HOME EXERCISE PROGRAM Access Code: [email or web address]  URL: [email or web address]  Date: 01/08/2019  Prepared by: Norton Blizzard   Exercises   Seated Correct Posture  HEP2Go:HALF-KNEELING HIP EXTENSION, left hip   .  PT Education - 01/22/19 1049    Education Details  Exercise purpose/form. Self management techniques. Education on diagnosis, prognosis, POC, anatomy and physiology  of current condition    Person(s) Educated  Patient    Methods  Explanation;Demonstration;Tactile cues;Verbal cues    Comprehension  Verbalized understanding;Returned demonstration       PT Short Term Goals - 01/17/19 2751      PT SHORT TERM GOAL #1   Title  Be independent with initial home exercise program for self-management of symptoms.    Baseline  does not complete as prescribed, partially compliant (01/10/2019);     Time  2    Period  Weeks    Status  Achieved    Target Date  01/24/19        PT Long Term Goals - 12/27/18 1147      PT LONG TERM GOAL #1   Title  Be independent with a long-term home exercise program for self-management of symptoms.     Baseline  to be established on visit 2    Time  6    Period  Weeks    Status  New    Target Date  02/07/19      PT LONG TERM GOAL #2   Title  Demonstrate improved FOTO score by 10 units to  demonstrate improvement in overall condition and self-reported functional ability.     Baseline  FOTO = 42    Time  6    Period  Weeks    Status  New    Target Date  02/07/19      PT LONG TERM GOAL #3   Title  Patient will demonstrate B knee strength 5/5 to demonstrate functional strength for independent gait, increased standing tolerance, squatting, lifting, carrying and increased ADL ability.    Baseline  see objective exam (12/27/2018);     Time  6    Period  Weeks    Status  New    Target Date  02/07/19      PT LONG TERM GOAL #4   Title  Complete community, work and/or recreational activities without limitation due to current condition.     Baseline  difficulty working, walking, child care, stairs, bending, lifting, child care (12/27/2018);       PT LONG TERM GOAL #5   Title  Reduce pain with functional activities to equal or less than 3/10 to allow patient to complete usual activities including ADLs, IADLs, and social engagement with less difficulty.     Baseline  pain up to 12/10 (12/27/2018);     Time  6    Period  Weeks    Status  New    Target Date  02/07/19            Plan - 01/22/19 1106    Clinical Impression Statement  Pt tolerated treatment well and is making good progress towards goals at this point. She continues to have abolishment of pain with repeated hip extension.She reported very high level of pain upon arrival today but it seems more localized and per observation she was not in distress beyond mild antalgic gait. This pain was also abolished with about 10 repetitions of hip extension. She continued to tolerate progressive exercises and required less breaks for repeated hip extension.  She had mild reproduction of symptoms with other exercises, but this was resolved with repeated hip extension. Continues to be in less focused on pain today. She does have some difficulty describing symptoms, but it seems clear that they are better today. Pt was able to complete  all exercises with minimal to no lasting increase in pain or  discomfort. Pt required multimodal cuing for proper technique and to facilitate improved neuromuscular control, strength, range of motion, and functional ability resulting in improved performance and form. Patient will benefit from skilled physical therapy intervention to address current body structure impairments and activity limitations to improve function and work towards goals set in current POC in order to return to prior level of function or maximal functional improvement.    Rehab Potential  Fair    Clinical Impairments Affecting Rehab Potential  (+) is seeking care and new employment; (-) high level of focus on pain and disability, 2+ comorbidities, obesity, unknown source of pain at this point    PT Frequency  2x / week    PT Duration  6 weeks    PT Treatment/Interventions  ADLs/Self Care Home Management;Cryotherapy;Moist Heat;Gait training;Stair training;Functional mobility training;Therapeutic activities;Therapeutic exercise;Balance training;Neuromuscular re-education;Patient/family education;Manual techniques;Passive range of motion;Dry needling;Taping;Joint Manipulations;Spinal Manipulations;Other (comment)   joint mobilizations grades I-IV   PT Next Visit Plan  continues to respond to repeated left hip extension. Plan to progress to general LE and functional strengthening with repeated hip extension interspersed as needed to control symptoms.      PT Home Exercise Plan  Medbridge Acces Code: [email or web address]     Consulted and Agree with Plan of Care  Patient       Patient will benefit from skilled therapeutic intervention in order to improve the following deficits and impairments:  Decreased balance, Difficulty walking, Decreased mobility, Impaired sensation, Decreased range of motion, Impaired perceived functional ability, Obesity, Decreased activity tolerance, Decreased strength, Pain, Impaired flexibility, Impaired UE functional  use  Visit Diagnosis: Pain in both lower extremities  Bilateral low back pain, unspecified chronicity, unspecified whether sciatica present  Difficulty in walking, not elsewhere classified  Muscle weakness (generalized)     Problem List Patient Active Problem List   Diagnosis Date Noted  . Dysphagia   . Non-intractable vomiting with nausea   . Special screening for malignant neoplasms, colon   . Diverticulosis of large intestine without diverticulitis   . Hyperthyroidism 04/18/2018  . Abnormal bowel movement 01/25/2018  . Fatigue 01/25/2018  . Current mild episode of major depressive disorder without prior episode (HCC) 01/25/2018  . Vitamin D deficiency disease 11/27/2015  . Intractable chronic migraine without aura and without status migrainosus 08/08/2015  . Obesity, Class II, BMI 35-39.9 07/08/2015    Cira Rue, PT, DPT 01/22/2019, 11:06 AM  [place] [facility] PHYSICAL AND SPORTS MEDICINE [address], [place], [address] Phone: [phone]   Fax:  [phone]  Name: Donna Neal MRN: [id number] Date of Birth: Nov 21, 1970

## 2019-01-24 ENCOUNTER — Ambulatory Visit: Payer: BLUE CROSS/BLUE SHIELD | Admitting: Physical Therapy

## 2019-01-29 ENCOUNTER — Ambulatory Visit: Payer: BLUE CROSS/BLUE SHIELD | Admitting: Physical Therapy

## 2019-01-29 ENCOUNTER — Telehealth: Payer: Self-pay | Admitting: Physical Therapy

## 2019-01-29 NOTE — Telephone Encounter (Signed)
Called pt to update about clinic closing for in -person visits due to COVID-19 pandemic. No answer. Left message letting pt know their appointments are canceled at this time, we are monitoring the phones/voicemail, are looking into telehealth options, and would like to know if they is interested in that. Also let pt know that I am available by phone to discuss any concerns, answer questions, provide advice, modify exercise program, and problem solve remotely; that I want to and be there with them as much as possible without in-person visits. Advised pt to call if they have any PT needs or questions. Provided call back number:  [phone].  

## 2019-01-31 ENCOUNTER — Ambulatory Visit: Payer: BLUE CROSS/BLUE SHIELD | Admitting: Physical Therapy

## 2019-02-05 ENCOUNTER — Ambulatory Visit: Payer: BLUE CROSS/BLUE SHIELD | Admitting: Physical Therapy

## 2019-02-08 ENCOUNTER — Encounter: Payer: BLUE CROSS/BLUE SHIELD | Admitting: Physical Therapy

## 2019-02-12 ENCOUNTER — Encounter: Payer: BLUE CROSS/BLUE SHIELD | Admitting: Physical Therapy

## 2019-02-14 ENCOUNTER — Encounter: Payer: BLUE CROSS/BLUE SHIELD | Admitting: Physical Therapy

## 2019-02-21 ENCOUNTER — Other Ambulatory Visit: Payer: Self-pay | Admitting: *Deleted

## 2019-02-21 DIAGNOSIS — M792 Neuralgia and neuritis, unspecified: Secondary | ICD-10-CM

## 2019-03-23 ENCOUNTER — Ambulatory Visit: Payer: BLUE CROSS/BLUE SHIELD | Admitting: Neurology

## 2019-03-27 ENCOUNTER — Other Ambulatory Visit: Payer: Self-pay

## 2019-03-27 ENCOUNTER — Ambulatory Visit (INDEPENDENT_AMBULATORY_CARE_PROVIDER_SITE_OTHER): Payer: BLUE CROSS/BLUE SHIELD | Admitting: Neurology

## 2019-03-27 DIAGNOSIS — M792 Neuralgia and neuritis, unspecified: Secondary | ICD-10-CM | POA: Diagnosis not present

## 2019-03-27 DIAGNOSIS — M79604 Pain in right leg: Secondary | ICD-10-CM

## 2019-03-27 NOTE — Procedures (Signed)
[facility]  [address], [address]  [place], [place] [address] Tel: [phone] Fax:  [phone] Test Date:  03/27/2019  Patient: Donna MajorKaren Neal DOB: 26-Aug-1971 Physician: Nita Sickleonika Patel, DO  Sex: Female Height: 5\' 1"  Ref Phys: Kennedy BuckerMichael Menz, MD  ID#: [id number] Temp: 33.0C Technician:    Patient Complaints: This is a 48 year old female referred for evaluation of bilateral achy leg pain and weakness.  NCV & EMG Findings: Electrodiagnostic testing of the right lower extremity and additional studies of the left shows: 1. Bilateral sural and superficial peroneal sensory responses are within normal limits. 2. Bilateral peroneal and tibial motor responses are within normal limits.   3. Bilateral tibial H reflex studies are within normal limits. 4. There is no evidence of active or chronic motor axonal changes affecting any of the tested muscles.  Motor unit configuration and recruitment pattern is within normal limits.   Impression: This is a normal study of the lower extremities.  In particular, there is no evidence of a lumbosacral radiculopathy, sensorimotor polyneuropathy or diffuse myopathy.     ___________________________ Nita Sickleonika Patel, DO    Nerve Conduction Studies Anti Sensory Summary Table   Site NR Peak (ms) Norm Peak (ms) P-T Amp (V) Norm P-T Amp  Left Sup Peroneal Anti Sensory (Ant Lat Mall)  33C  12 cm    2.1 <4.6 18.5 >4  Right Sup Peroneal Anti Sensory (Ant Lat Mall)  33C  12 cm    1.9 <4.6 21.4 >4  Left Sural Anti Sensory (Lat Mall)  33C  Calf    2.8 <4.6 24.8 >4  Right Sural Anti Sensory (Lat Mall)  33C  Calf    2.0 <4.6 24.7 >4   Motor Summary Table   Site NR Onset (ms) Norm Onset (ms) O-P Amp (mV) Norm O-P Amp Site1 Site2 Delta-0 (ms) Dist (cm) Vel (m/s) Norm Vel (m/s)  Left Peroneal Motor (Ext Dig Brev)  33C  Ankle    2.9 <6.0 4.0 >2.5 B Fib Ankle 6.6 34.0 52 >40  B Fib    9.5  3.8  Poplt B Fib 1.4 8.0 57 >40  Poplt    10.9  3.6          Right Peroneal Motor (Ext Dig Brev)  33C  Ankle    2.3 <6.0 4.9 >2.5 B Fib Ankle 6.8 36.0 53 >40  B Fib    9.1  4.6  Poplt B Fib 1.2 8.0 67 >40  Poplt    10.3  4.5         Left Tibial Motor (Abd Hall Brev)  33C  Ankle    4.3 <6.0 13.4 >4 Knee Ankle 7.8 36.0 46 >40  Knee    12.1  11.3         Right Tibial Motor (Abd Hall Brev)  33C  Ankle    2.8 <6.0 11.6 >4 Knee Ankle 7.4 37.0 50 >40  Knee    10.2  9.7          H Reflex Studies   NR H-Lat (ms) Lat Norm (ms) L-R H-Lat (ms)  Left Tibial (Gastroc)  33C     30.20 <35 0.00  Right Tibial (Gastroc)  33C     30.20 <35 0.00   EMG   Side Muscle Ins Act Fibs Psw Fasc Number Recrt Dur Dur. Amp Amp. Poly Poly. Comment  Right AntTibialis Nml Nml Nml Nml Nml Nml Nml Nml Nml Nml Nml Nml N/A  Right Gastroc Nml  Nml Nml Nml Nml Nml Nml Nml Nml Nml Nml Nml N/A  Right Flex Dig Long Nml Nml Nml Nml Nml Nml Nml Nml Nml Nml Nml Nml N/A  Right RectFemoris Nml Nml Nml Nml Nml Nml Nml Nml Nml Nml Nml Nml N/A  Right GluteusMed Nml Nml Nml Nml Nml Nml Nml Nml Nml Nml Nml Nml N/A  Left AntTibialis Nml Nml Nml Nml Nml Nml Nml Nml Nml Nml Nml Nml N/A  Left Gastroc Nml Nml Nml Nml Nml Nml Nml Nml Nml Nml Nml Nml N/A  Left Flex Dig Long Nml Nml Nml Nml Nml Nml Nml Nml Nml Nml Nml Nml N/A  Left GluteusMed Nml Nml Nml Nml Nml Nml Nml Nml Nml Nml Nml Nml N/A  Left RectFemoris Nml Nml Nml Nml Nml Nml Nml Nml Nml Nml Nml Nml N/A      Waveforms:

## 2019-08-10 ENCOUNTER — Ambulatory Visit (LOCAL_COMMUNITY_HEALTH_CENTER): Payer: Self-pay

## 2019-08-10 ENCOUNTER — Other Ambulatory Visit: Payer: Self-pay

## 2019-08-10 DIAGNOSIS — Z111 Encounter for screening for respiratory tuberculosis: Secondary | ICD-10-CM

## 2019-08-13 ENCOUNTER — Ambulatory Visit (LOCAL_COMMUNITY_HEALTH_CENTER): Payer: Self-pay

## 2019-08-13 ENCOUNTER — Other Ambulatory Visit: Payer: Self-pay

## 2019-08-13 DIAGNOSIS — Z111 Encounter for screening for respiratory tuberculosis: Secondary | ICD-10-CM

## 2019-08-13 LAB — TB SKIN TEST
Induration: 0 mm
TB Skin Test: NEGATIVE

## 2019-11-26 ENCOUNTER — Encounter: Payer: Self-pay | Admitting: Physical Therapy

## 2019-11-26 DIAGNOSIS — M545 Low back pain, unspecified: Secondary | ICD-10-CM

## 2019-11-26 DIAGNOSIS — R262 Difficulty in walking, not elsewhere classified: Secondary | ICD-10-CM

## 2019-11-26 DIAGNOSIS — M79604 Pain in right leg: Secondary | ICD-10-CM

## 2019-11-26 DIAGNOSIS — M79605 Pain in left leg: Secondary | ICD-10-CM

## 2019-11-26 DIAGNOSIS — M6281 Muscle weakness (generalized): Secondary | ICD-10-CM

## 2019-11-26 NOTE — Therapy (Signed)
[facility] [address] [place], [address] Phone: [phone]   Fax:  [phone]  Physical Therapy Treatment  Patient Details  Name: Donna Neal MRN: [id number] Date of Birth: 01-17-1971 Referring Provider (PT): Lauris Poag, MD   Encounter Date: 11/26/2019    Past Medical History:  Diagnosis Date  . Anxiety   . Depression   . Thyroid activity decreased     Past Surgical History:  Procedure Laterality Date  . COLONOSCOPY WITH PROPOFOL N/A 07/28/2018   Procedure: COLONOSCOPY WITH PROPOFOL;  Surgeon: Virgel Manifold, MD;  Location: [facility];  Service: Endoscopy;  Laterality: N/A;  . ESOPHAGOGASTRODUODENOSCOPY (EGD) WITH PROPOFOL N/A 07/28/2018   Procedure: ESOPHAGOGASTRODUODENOSCOPY (EGD) WITH PROPOFOL;  Surgeon: Virgel Manifold, MD;  Location: [facility]COPY;  Service: Endoscopy;  Laterality: N/A;  . NO PAST SURGERIES    . no past surgery      There were no vitals filed for this visit.  Subjective Assessment - 11/26/19 1013    Subjective  Patients session were placed on hold due to temporary clinic closure due to rising COVID 19 cases in the community. Patient did not return for further care when clinic re-opened.    Pertinent History  Patient is a 49 y.o. female referred to outpatient physical therapy with a medical diagnosis of neuropathic pain of both legs who presents with signs and symptoms consistent with non-specific low back pain and bilateral leg pain and weakness. Patient presents with significant pain, paresthesia, weakness, and decreased activity tolerance impairments that are limiting ability to complete sitting, standing, bending, lifting, childcare, work, standing, carrying, walking, and stairs without difficulty. Patient did not show a clear source of symptoms on physical examination and had some difficulty describing symptoms specifically during subjective  examination. Patient will benefit from skilled physical therapy intervention to address current body structure impairments and activity limitations to improve function and work towards goals set in current POC in order to return to prior level of function or maximal functional improvement.     How long can you sit comfortably?  < 20 min (if longer leg will be more more sore when she stands after).     How long can you stand comfortably?  < 10 min    How long can you walk comfortably?  < 45-60 min    Diagnostic tests  Imaging: lumbar MRI report dated 11/22/2018: "IMPRESSION: 1. No significant lumbar spine disc protrusion, foraminal stenosis or central canal stenosis."    Patient Stated Goals  Find out what is causing the pain and make the pain go away and get leg back to normal, get back to walking.     Pain Onset  More than a month ago       OBJECTIVE Patient is not present for examination at this time. Please see previous documentation for latest objective data.     PT Short Term Goals - 01/17/19 5638      PT SHORT TERM GOAL #1   Title  Be independent with initial home exercise program for self-management of symptoms.    Baseline  does not complete as prescribed, partially compliant (01/10/2019);     Time  2    Period  Weeks    Status  Achieved    Target Date  01/24/19        PT Long Term Goals - 11/26/19 1014      PT LONG TERM GOAL #1   Title  Be independent with a long-term home exercise program for self-management of symptoms.     Baseline  to be established on visit 2    Time  6    Period  Weeks    Status  Partially Met      PT LONG TERM GOAL #2   Title  Demonstrate improved FOTO score by 10 units to demonstrate improvement in overall condition and self-reported functional ability.     Baseline  FOTO = 42    Time  6    Period  Weeks    Status  Unable to assess      PT LONG TERM GOAL #3   Title  Patient will demonstrate B knee strength 5/5 to demonstrate functional  strength for independent gait, increased standing tolerance, squatting, lifting, carrying and increased ADL ability.    Baseline  see objective exam (12/27/2018);     Period  Weeks    Status  Partially Met      PT LONG TERM GOAL #4   Title  Complete community, work and/or recreational activities without limitation due to current condition.     Baseline  difficulty working, walking, child care, stairs, bending, lifting, child care (12/27/2018);     Time  6    Period  Weeks    Status  Partially Met      PT LONG TERM GOAL #5   Title  Reduce pain with functional activities to equal or less than 3/10 to allow patient to complete usual activities including ADLs, IADLs, and social engagement with less difficulty.     Baseline  pain up to 12/10 (12/27/2018);     Time  6    Period  Weeks    Status  Partially Met        Plan - 11/26/19 1018    Clinical Impression Statement  Patient attended 6 physical therapy sessions and consistently demonstrated dramatic improvement within session with return of symptoms by next session. She responded well to repeated hip extension in semi-loaded position. Her care was paused due to clinic closure out of precautions due to rising COVID 19 cases in the community. She did not return for care following clinic re-opening and is therefore discharged from physical therapy.    Rehab Potential  Fair    Clinical Impairments Affecting Rehab Potential  (+) is seeking care and new employment; (-) high level of focus on pain and disability, 2+ comorbidities, obesity, unknown source of pain at this point    PT Frequency  2x / week    PT Duration  6 weeks    PT Treatment/Interventions  ADLs/Self Care Home Management;Cryotherapy;Moist Heat;Gait training;Stair training;Functional mobility training;Therapeutic activities;Therapeutic exercise;Balance training;Neuromuscular re-education;Patient/family education;Manual techniques;Passive range of motion;Dry needling;Taping;Joint  Manipulations;Spinal Manipulations;Other (comment)   joint mobilizations grades I-IV   PT Next Visit Plan  Patient is discharged from physical therapy    PT [place] Code: [id number]     Consulted and Agree with Plan of Care  Patient       Patient will benefit from skilled therapeutic intervention in order to improve the following deficits and impairments:  Decreased balance, Difficulty walking, Decreased mobility, Impaired sensation, Decreased range of motion, Impaired perceived functional ability, Obesity, Decreased activity tolerance, Decreased strength, Pain, Impaired flexibility, Impaired UE functional use  Visit Diagnosis: Pain in both lower extremities  Bilateral low back pain, unspecified chronicity, unspecified whether sciatica present  Difficulty in walking, not elsewhere classified  Muscle weakness (generalized)  Problem List Patient Active Problem List   Diagnosis Date Noted  . Dysphagia   . Non-intractable vomiting with nausea   . Special screening for malignant neoplasms, colon   . Diverticulosis of large intestine without diverticulitis   . Hyperthyroidism 04/18/2018  . Abnormal bowel movement 01/25/2018  . Fatigue 01/25/2018  . Current mild episode of major depressive disorder without prior episode ([place]) 01/25/2018  . Vitamin D deficiency disease 11/27/2015  . Intractable chronic migraine without aura and without status migrainosus 08/08/2015  . Obesity, Class II, BMI 35-39.9 07/08/2015    Everlean Alstrom. Graylon Good, PT, DPT 11/26/19, 10:19 AM  [facility] [address], [place], [address] Phone: [phone]   Fax:  [phone]  Name: Donna Neal MRN: [id number] Date of Birth: 1971-01-09

## 2024-03-06 ENCOUNTER — Encounter (HOSPITAL_COMMUNITY): Payer: Self-pay

## 2024-03-16 ENCOUNTER — Inpatient Hospital Stay (HOSPITAL_COMMUNITY): Admission: RE | Admit: 2024-03-16 | Source: Ambulatory Visit

## 2024-03-16 DIAGNOSIS — Z1231 Encounter for screening mammogram for malignant neoplasm of breast: Secondary | ICD-10-CM

## 2024-10-30 ENCOUNTER — Other Ambulatory Visit: Payer: Self-pay | Admitting: Medical Genetics
